# Patient Record
Sex: Male | Born: 1975
Health system: Southern US, Community
[De-identification: ages and names within clinical notes are randomized; demographics above are authoritative.]

## PROBLEM LIST (undated history)

## (undated) DIAGNOSIS — E785 Hyperlipidemia, unspecified: Secondary | ICD-10-CM

## (undated) DIAGNOSIS — G4730 Sleep apnea, unspecified: Secondary | ICD-10-CM

## (undated) DIAGNOSIS — I10 Essential (primary) hypertension: Secondary | ICD-10-CM

## (undated) DIAGNOSIS — Z992 Dependence on renal dialysis: Secondary | ICD-10-CM

## (undated) DIAGNOSIS — N186 End stage renal disease: Secondary | ICD-10-CM

## (undated) DIAGNOSIS — E119 Type 2 diabetes mellitus without complications: Secondary | ICD-10-CM

## (undated) DIAGNOSIS — N189 Chronic kidney disease, unspecified: Secondary | ICD-10-CM

## (undated) HISTORY — DX: Hyperlipidemia, unspecified: E78.5

## (undated) HISTORY — DX: Essential (primary) hypertension: I10

## (undated) HISTORY — DX: Chronic kidney disease, unspecified: N18.9

## (undated) HISTORY — DX: Type 2 diabetes mellitus without complications: E11.9

---

## 2006-08-01 ENCOUNTER — Emergency Department (HOSPITAL_COMMUNITY): Admission: EM | Admit: 2006-08-01 | Discharge: 2006-08-01 | Payer: Self-pay | Admitting: Emergency Medicine

## 2011-07-16 ENCOUNTER — Emergency Department (HOSPITAL_COMMUNITY)
Admission: EM | Admit: 2011-07-16 | Discharge: 2011-07-16 | Disposition: A | Discharge status: Home / Self Care | Payer: Self-pay | Source: Home / Self Care

## 2011-07-16 DIAGNOSIS — K529 Noninfective gastroenteritis and colitis, unspecified: Secondary | ICD-10-CM

## 2011-07-16 DIAGNOSIS — K5289 Other specified noninfective gastroenteritis and colitis: Secondary | ICD-10-CM

## 2011-07-16 MED ORDER — ONDANSETRON 4 MG PO TBDP
8.0000 mg | ORAL_TABLET | Freq: Once | ORAL | Status: AC
  Administered 2011-07-16: 8 mg via ORAL

## 2011-07-16 MED ORDER — ONDANSETRON 4 MG PO TBDP
ORAL_TABLET | ORAL | Status: AC
  Filled 2011-07-16: qty 2

## 2011-07-16 MED ORDER — ONDANSETRON HCL 4 MG PO TABS: 4.0000 mg | ORAL_TABLET | Freq: Three times a day (TID) | ORAL | Status: AC | PRN

## 2011-07-16 MED ORDER — LOPERAMIDE HCL 2 MG PO CAPS: ORAL_CAPSULE | ORAL | Status: DC

## 2011-07-16 NOTE — ED Provider Notes (Signed)
History     CSN: [id number] Arrival date & time: 07/16/2011  5:27 PM   None     Chief Complaint  Patient presents with  . Emesis    Pt has vomiting and diarrhea since Sat am  . Diarrhea    (Consider location/radiation/quality/duration/timing/severity/associated sxs/prior treatment) HPI Comments: Pt states he began with N/V/D yesterday after eating at a relatives home the day before. He knows one other family member reported not feeling well but uncertain if has the same symptoms. Diarrhea is watery and occurs every hour. Even awakens him from sleep. Stomach cramping but no abd pain. Nausea and vomiting. No fever. Denies recent travel or antibiotics.   Patient is a 35 y.o. male presenting with diarrhea. The history is provided by the patient.  Diarrhea The primary symptoms include nausea, vomiting and diarrhea. Primary symptoms do not include fever, abdominal pain, dysuria or myalgias. The illness began yesterday. The onset was sudden. The problem has not changed since onset. Nausea began yesterday. The nausea is exacerbated by food.  The vomiting began yesterday. Vomiting occurs 2 to 5 times per day. The emesis contains stomach contents. Risk factors for illness leading to emesis include suspect food intake.  The diarrhea began yesterday. The diarrhea is watery. The diarrhea occurs more than 10 times per day. Risk factors for illness producing diarrhea include suspect food intake.  The illness does not include chills or constipation.    History reviewed. No pertinent past medical history.  History reviewed. No pertinent past surgical history.  History reviewed. No pertinent family history.  History  Substance Use Topics  . Smoking status: Never Smoker   . Smokeless tobacco: Not on file  . Alcohol Use: No      Review of Systems  Constitutional: Negative for fever and chills.  Respiratory: Negative for shortness of breath.   Cardiovascular: Negative for chest pain.    Gastrointestinal: Positive for nausea, vomiting and diarrhea. Negative for abdominal pain, constipation and abdominal distention.  Genitourinary: Negative for dysuria, frequency and decreased urine volume.  Musculoskeletal: Negative for myalgias.  Neurological: Negative for dizziness and headaches.    Allergies  Review of patient's allergies indicates no known allergies.  Home Medications   Current Outpatient Rx  Name Route Sig Dispense Refill  . LOPERAMIDE HCL 2 MG PO CAPS  Initial dose 4 mg, then 2 mg after each loose stool. Maximum of 8 caps per 24 hrs. 24 capsule 0  . ONDANSETRON HCL 4 MG PO TABS Oral Take 1 tablet (4 mg total) by mouth every 8 (eight) hours as needed for nausea. 6 tablet 0    BP 148/102  Pulse 98  Temp(Src) 98.7 F (37.1 C) (Oral)  Resp 18  SpO2 97%  Physical Exam  Nursing note and vitals reviewed. Constitutional: He appears well-developed and well-nourished. No distress.  HENT:  Head: Normocephalic and atraumatic.  Right Ear: Tympanic membrane, external ear and ear canal normal.  Left Ear: Tympanic membrane, external ear and ear canal normal.  Nose: Nose normal.  Mouth/Throat: Oropharynx is clear and moist and mucous membranes are normal. No oropharyngeal exudate.  Neck: Neck supple.  Cardiovascular: Normal rate, regular rhythm and normal heart sounds.   Pulmonary/Chest: Effort normal and breath sounds normal. No respiratory distress.  Abdominal: Soft. Bowel sounds are normal. He exhibits no distension and no mass. There is tenderness (mild, diffuse). There is no rebound and no guarding.  Lymphadenopathy:    He has no cervical adenopathy.  Neurological: He is  alert.  Skin: Skin is warm and dry.  Psychiatric: He has a normal mood and affect.    ED Course  Procedures (including critical care time)  Labs Reviewed - No data to display No results found.   1. Gastroenteritis       MDM  BP rechecked        Carmel [place], PA 07/16/11  [place], [place] 07/16/11 [phone]

## 2011-07-17 NOTE — ED Provider Notes (Signed)
Medical screening examination/treatment/procedure(s) were performed by non-physician practitioner and as supervising physician I was immediately available for consultation/collaboration.   Pauline Good MD.    Pauline Good, MD 07/17/11 [phone]

## 2017-11-07 DIAGNOSIS — E1159 Type 2 diabetes mellitus with other circulatory complications: Secondary | ICD-10-CM | POA: Insufficient documentation

## 2017-11-07 DIAGNOSIS — I10 Essential (primary) hypertension: Secondary | ICD-10-CM | POA: Insufficient documentation

## 2017-11-07 DIAGNOSIS — E66811 Obesity, class 1: Secondary | ICD-10-CM | POA: Insufficient documentation

## 2017-11-07 DIAGNOSIS — M2141 Flat foot [pes planus] (acquired), right foot: Secondary | ICD-10-CM | POA: Insufficient documentation

## 2018-02-11 ENCOUNTER — Ambulatory Visit: Payer: BLUE CROSS/BLUE SHIELD | Admitting: Podiatry

## 2018-02-11 ENCOUNTER — Encounter: Payer: Self-pay | Admitting: Podiatry

## 2018-02-11 ENCOUNTER — Ambulatory Visit (INDEPENDENT_AMBULATORY_CARE_PROVIDER_SITE_OTHER): Payer: BLUE CROSS/BLUE SHIELD

## 2018-02-11 DIAGNOSIS — M722 Plantar fascial fibromatosis: Secondary | ICD-10-CM

## 2018-02-11 DIAGNOSIS — M779 Enthesopathy, unspecified: Secondary | ICD-10-CM

## 2018-02-11 MED ORDER — MELOXICAM 15 MG PO TABS: 15.0000 mg | ORAL_TABLET | Freq: Every day | ORAL | 0 refills | Status: DC

## 2018-02-11 NOTE — Patient Instructions (Signed)
Plantar Fasciitis (Heel Spur Syndrome) with Rehab The plantar fascia is a fibrous, ligament-like, soft-tissue structure that spans the bottom of the foot. Plantar fasciitis is a condition that causes pain in the foot due to inflammation of the tissue. SYMPTOMS   Pain and tenderness on the underneath side of the foot.  Pain that worsens with standing or walking. CAUSES  Plantar fasciitis is caused by irritation and injury to the plantar fascia on the underneath side of the foot. Common mechanisms of injury include:  Direct trauma to bottom of the foot.  Damage to a small nerve that runs under the foot where the main fascia attaches to the heel bone.  Stress placed on the plantar fascia due to bone spurs. RISK INCREASES WITH:   Activities that place stress on the plantar fascia (running, jumping, pivoting, or cutting).  Poor strength and flexibility.  Improperly fitted shoes.  Tight calf muscles.  Flat feet.  Failure to warm-up properly before activity.  Obesity. PREVENTION  Warm up and stretch properly before activity.  Allow for adequate recovery between workouts.  Maintain physical fitness:  Strength, flexibility, and endurance.  Cardiovascular fitness.  Maintain a health body weight.  Avoid stress on the plantar fascia.  Wear properly fitted shoes, including arch supports for individuals who have flat feet.  PROGNOSIS  If treated properly, then the symptoms of plantar fasciitis usually resolve without surgery. However, occasionally surgery is necessary.  RELATED COMPLICATIONS   Recurrent symptoms that may result in a chronic condition.  Problems of the lower back that are caused by compensating for the injury, such as limping.  Pain or weakness of the foot during push-off following surgery.  Chronic inflammation, scarring, and partial or complete fascia tear, occurring more often from repeated injections.  TREATMENT  Treatment initially involves the  use of ice and medication to help reduce pain and inflammation. The use of strengthening and stretching exercises may help reduce pain with activity, especially stretches of the Achilles tendon. These exercises may be performed at home or with a therapist. Your caregiver may recommend that you use heel cups of arch supports to help reduce stress on the plantar fascia. Occasionally, corticosteroid injections are given to reduce inflammation. If symptoms persist for greater than 6 months despite non-surgical (conservative), then surgery may be recommended.   MEDICATION   If pain medication is necessary, then nonsteroidal anti-inflammatory medications, such as aspirin and ibuprofen, or other minor pain relievers, such as acetaminophen, are often recommended.  Do not take pain medication within 7 days before surgery.  Prescription pain relievers may be given if deemed necessary by your caregiver. Use only as directed and only as much as you need.  Corticosteroid injections may be given by your caregiver. These injections should be reserved for the most serious cases, because they may only be given a certain number of times.  HEAT AND COLD  Cold treatment (icing) relieves pain and reduces inflammation. Cold treatment should be applied for 10 to 15 minutes every 2 to 3 hours for inflammation and pain and immediately after any activity that aggravates your symptoms. Use ice packs or massage the area with a piece of ice (ice massage).  Heat treatment may be used prior to performing the stretching and strengthening activities prescribed by your caregiver, physical therapist, or athletic trainer. Use a heat pack or soak the injury in warm water.  SEEK IMMEDIATE MEDICAL CARE IF:  Treatment seems to offer no benefit, or the condition worsens.  Any medications   produce adverse side effects.  EXERCISES- RANGE OF MOTION (ROM) AND STRETCHING EXERCISES - Plantar Fasciitis (Heel Spur Syndrome) These exercises  may help you when beginning to rehabilitate your injury. Your symptoms may resolve with or without further involvement from your physician, physical therapist or athletic trainer. While completing these exercises, remember:   Restoring tissue flexibility helps normal motion to return to the joints. This allows healthier, less painful movement and activity.  An effective stretch should be held for at least 30 seconds.  A stretch should never be painful. You should only feel a gentle lengthening or release in the stretched tissue.  RANGE OF MOTION - Toe Extension, Flexion  Sit with your right / left leg crossed over your opposite knee.  Grasp your toes and gently pull them back toward the top of your foot. You should feel a stretch on the bottom of your toes and/or foot.  Hold this stretch for 10 seconds.  Now, gently pull your toes toward the bottom of your foot. You should feel a stretch on the top of your toes and or foot.  Hold this stretch for 10 seconds. Repeat  times. Complete this stretch 3 times per day.   RANGE OF MOTION - Ankle Dorsiflexion, Active Assisted  Remove shoes and sit on a chair that is preferably not on a carpeted surface.  Place right / left foot under knee. Extend your opposite leg for support.  Keeping your heel down, slide your right / left foot back toward the chair until you feel a stretch at your ankle or calf. If you do not feel a stretch, slide your bottom forward to the edge of the chair, while still keeping your heel down.  Hold this stretch for 10 seconds. Repeat 3 times. Complete this stretch 2 times per day.   STRETCH  Gastroc, Standing  Place hands on wall.  Extend right / left leg, keeping the front knee somewhat bent.  Slightly point your toes inward on your back foot.  Keeping your right / left heel on the floor and your knee straight, shift your weight toward the wall, not allowing your back to arch.  You should feel a gentle stretch  in the right / left calf. Hold this position for 10 seconds. Repeat 3 times. Complete this stretch 2 times per day.  STRETCH  Soleus, Standing  Place hands on wall.  Extend right / left leg, keeping the other knee somewhat bent.  Slightly point your toes inward on your back foot.  Keep your right / left heel on the floor, bend your back knee, and slightly shift your weight over the back leg so that you feel a gentle stretch deep in your back calf.  Hold this position for 10 seconds. Repeat 3 times. Complete this stretch 2 times per day.  STRETCH  Gastrocsoleus, Standing  Note: This exercise can place a lot of stress on your foot and ankle. Please complete this exercise only if specifically instructed by your caregiver.   Place the ball of your right / left foot on a step, keeping your other foot firmly on the same step.  Hold on to the wall or a rail for balance.  Slowly lift your other foot, allowing your body weight to press your heel down over the edge of the step.  You should feel a stretch in your right / left calf.  Hold this position for 10 seconds.  Repeat this exercise with a slight bend in your right /   left knee. Repeat 3 times. Complete this stretch 2 times per day.   STRENGTHENING EXERCISES - Plantar Fasciitis (Heel Spur Syndrome)  These exercises may help you when beginning to rehabilitate your injury. They may resolve your symptoms with or without further involvement from your physician, physical therapist or athletic trainer. While completing these exercises, remember:   Muscles can gain both the endurance and the strength needed for everyday activities through controlled exercises.  Complete these exercises as instructed by your physician, physical therapist or athletic trainer. Progress the resistance and repetitions only as guided.  STRENGTH - Towel Curls  Sit in a chair positioned on a non-carpeted surface.  Place your foot on a towel, keeping your heel  on the floor.  Pull the towel toward your heel by only curling your toes. Keep your heel on the floor. Repeat 3 times. Complete this exercise 2 times per day.  STRENGTH - Ankle Inversion  Secure one end of a rubber exercise band/tubing to a fixed object (table, pole). Loop the other end around your foot just before your toes.  Place your fists between your knees. This will focus your strengthening at your ankle.  Slowly, pull your big toe up and in, making sure the band/tubing is positioned to resist the entire motion.  Hold this position for 10 seconds.  Have your muscles resist the band/tubing as it slowly pulls your foot back to the starting position. Repeat 3 times. Complete this exercises 2 times per day.  Document Released: 08/07/2005 Document Revised: 10/30/2011 Document Reviewed: 11/19/2008 [facility] Patient Information 2014 [place], [place].   Peroneal Tendinopathy Rehab Ask your health care provider which exercises are safe for you. Do exercises exactly as told by your health care provider and adjust them as directed. It is normal to feel mild stretching, pulling, tightness, or discomfort as you do these exercises, but you should stop right away if you feel sudden pain or your pain gets worse.Do not begin these exercises until told by your health care provider. Stretching and range of motion exercises These exercises warm up your muscles and joints and improve the movement and flexibility of your ankle. These exercises also help to relieve pain and stiffness. Exercise A: Gastroc and soleus, standing 1. Stand on the edge of a step on the balls of your feet. The ball of your foot is on the walking surface, right under your toes. 2. Hold onto the railing for balance. 3. Slowly lift your left / right foot, allowing your body weight to press your left / right heel down over the edge of the step. You should feel a stretch in your left / right calf. 4. Hold this position for __________  seconds. Repeat __________ times with your left / right knee straight and __________ times with your left / right knee bent. Complete this stretch __________ times per day. Strengthening exercises These exercises improve the strength and endurance of your foot and ankle. Endurance is the ability to use your muscles for a long time, even after they get tired. Exercise B: Dorsiflexors  1. Secure a rubber exercise band or tube to an object, like a table leg, that will not move if it is pulled on. 2. Secure the other end of the band around your left / right foot. 3. Sit on the floor, facing the object with your left / right foot extended. The band or tube should be slightly tense when your foot is relaxed. 4. Slowly flex your left / right ankle and  toes to bring your foot toward you. 5. Hold this position for __________ seconds. 6. Slowly return your foot to the starting position. Repeat __________ times. Complete this exercise __________ times per day. Exercise C: Evertors 1. Sit on the floor with your legs straight out in front of you. 2. Loop a rubber exercise or band or tube around the ball of your left / right foot. The ball of your foot is on the walking surface, right under your toes. 3. Hold the ends of the band in your hands, or secure the band to a stable object. 4. Slowly push your foot outward, away from your other leg. 5. Hold this position for __________ seconds. 6. Slowly return your foot to the starting position. Repeat __________ times. Complete this exercise __________ times per day. Exercise D: Standing heel raise ( plantar flexion) 1. Stand with your feet shoulder-width apart with the balls of your feet on a step. The ball of your foot is on the walking surface, right under your toes. 2. Keep your weight spread evenly over the width of your feet while you rise up on your toes. Use a wall or railing to steady yourself, but try not to use it for support. 3. If this exercise is  too easy, try these options: ? Shift your weight toward your left / right leg until you feel challenged. ? If told by your health care provider, stand on your left / right leg only. 4. Hold this position for __________ seconds. Repeat __________ times. Complete this exercise __________ times per day. Exercise E: Single leg stand 1. Without shoes, stand near a railing or in a doorway. You may hold onto the railing or door frame as needed. 2. Stand on your left / right foot. Keep your big toe down on the floor and try to keep your arch lifted. ? Do not roll to the outside of your foot. ? If this exercise is too easy, you can try it with your eyes closed or while standing on a pillow. 3. Hold this position for __________ seconds. Repeat __________ times. Complete this exercise __________ times per day. This information is not intended to replace advice given to you by your health care provider. Make sure you discuss any questions you have with your health care provider. Document Released: 08/07/2005 Document Revised: 04/13/2016 Document Reviewed: 06/26/2015 [employer] Interactive Patient Education  [employer].

## 2018-02-11 NOTE — Progress Notes (Signed)
   Subjective:    Patient ID: Joshua Mullins, male    DOB: 11/06/1975, 42 y.o.   MRN: [id number]  HPI 42 year old male presents the office today for concerns of pain to both of his feet with the right side worse than left.  He states this sore on the bottom on Saturday started there is some pain with side of his foot.  He did note some mild swelling but denies any recent injury or trauma.  Still with pressure after prolonged activity.  Has been trying ice and ibuprofen which does help.  He states that after rest he notices some stiffness and it hurts to lay on.  Denies any numbness or tingling.  No other concerns.   Review of Systems  All other systems reviewed and are negative.  History reviewed. No pertinent past medical history.  History reviewed. No pertinent surgical history.   Current Outpatient Medications:  .  loperamide (IMODIUM) 2 MG capsule, Initial dose 4 mg, then 2 mg after each loose stool. Maximum of 8 caps per 24 hrs., Disp: 24 capsule, Rfl: 0 .  meloxicam (MOBIC) 15 MG tablet, Take 1 tablet (15 mg total) by mouth daily., Disp: 30 tablet, Rfl: 0  No Known Allergies       Objective:   Physical Exam  General: AAO x3, NAD  Dermatological: Skin is warm, dry and supple bilateral. Nails x 10 are well manicured; remaining integument appears unremarkable at this time. There are no open sores, no preulcerative lesions, no rash or signs of infection present.  Vascular: Dorsalis Pedis artery and Posterior Tibial artery pedal pulses are 2/4 bilateral with immedate capillary fill time. Pedal hair growth present. No varicosities and no lower extremity edema present bilateral. There is no pain with calf compression, swelling, warmth, erythema.   Neruologic: Grossly intact via light touch bilateral. Vibratory intact via tuning fork bilateral. Protective threshold with Semmes Wienstein monofilament intact to all pedal sites bilateral.  Tinel sign negative.  Musculoskeletal: There  is minimal discomfort on palpation of the plantar medial tubercle of the calcaneus at the insertion of plantar fascia bilaterally.  There is also some mild discomfort on the course of the peroneal tendon more in the left side and is just posterior and inferior to the lateral malleolus on the insertion of the fifth metatarsal base.  Overall the peroneal tendon appears to be intact.  There is no pain with lateral compression of calcaneus.  Achilles tendon appears to be intact.  There is no overlying edema, erythema, increase in warmth.  Muscular strength 5/5 in all groups tested bilateral.  Mild midfoot dorsal exostosis palpable on the left midfoot.  Gait: Unassisted, Nonantalgic.      Assessment & Plan:  42 year old male with bilateral foot pain likely plantar fasciitis with peroneal tendinitis -Treatment options discussed including all alternatives, risks, and complications -Etiology of symptoms were discussed -X-rays were obtained and reviewed with the patient.  There is no evidence of acute fracture or stress fracture identified today.  Heel spurs are present. -Prescribed mobic. Discussed side effects of the medication and directed to stop if any are to occur and call the office.  -Stretching and icing daily -Plantar fascial braces -Discussed shoe modifications and orthotics.  -Discussed steroid injection  Return in about 3 weeks (around 03/04/2018).  Trula Slade DPM

## 2018-03-05 ENCOUNTER — Ambulatory Visit: Payer: BLUE CROSS/BLUE SHIELD | Admitting: Podiatry

## 2018-03-10 ENCOUNTER — Other Ambulatory Visit: Payer: Self-pay | Admitting: Podiatry

## 2018-03-20 ENCOUNTER — Other Ambulatory Visit: Payer: Self-pay | Admitting: Nephrology

## 2018-03-20 DIAGNOSIS — N184 Chronic kidney disease, stage 4 (severe): Secondary | ICD-10-CM

## 2018-03-20 DIAGNOSIS — R809 Proteinuria, unspecified: Secondary | ICD-10-CM

## 2018-03-20 DIAGNOSIS — I129 Hypertensive chronic kidney disease with stage 1 through stage 4 chronic kidney disease, or unspecified chronic kidney disease: Secondary | ICD-10-CM

## 2018-03-20 DIAGNOSIS — E669 Obesity, unspecified: Secondary | ICD-10-CM

## 2018-03-27 ENCOUNTER — Ambulatory Visit
Admission: RE | Admit: 2018-03-27 | Discharge: 2018-03-27 | Disposition: A | Discharge status: Home / Self Care | Payer: Self-pay | Source: Ambulatory Visit | Attending: Nephrology | Admitting: Nephrology

## 2018-03-27 DIAGNOSIS — E669 Obesity, unspecified: Secondary | ICD-10-CM

## 2018-03-27 DIAGNOSIS — I129 Hypertensive chronic kidney disease with stage 1 through stage 4 chronic kidney disease, or unspecified chronic kidney disease: Secondary | ICD-10-CM

## 2018-03-27 DIAGNOSIS — R809 Proteinuria, unspecified: Secondary | ICD-10-CM

## 2018-03-27 DIAGNOSIS — N184 Chronic kidney disease, stage 4 (severe): Secondary | ICD-10-CM

## 2018-04-18 ENCOUNTER — Other Ambulatory Visit: Payer: Self-pay | Admitting: Nephrology

## 2018-04-18 DIAGNOSIS — N281 Cyst of kidney, acquired: Secondary | ICD-10-CM

## 2018-04-30 ENCOUNTER — Ambulatory Visit
Admission: RE | Admit: 2018-04-30 | Discharge: 2018-04-30 | Disposition: A | Discharge status: Home / Self Care | Payer: BLUE CROSS/BLUE SHIELD | Source: Ambulatory Visit | Attending: Nephrology | Admitting: Nephrology

## 2018-04-30 DIAGNOSIS — N281 Cyst of kidney, acquired: Secondary | ICD-10-CM

## 2019-03-25 DIAGNOSIS — E559 Vitamin D deficiency, unspecified: Secondary | ICD-10-CM | POA: Insufficient documentation

## 2019-07-04 DIAGNOSIS — E1169 Type 2 diabetes mellitus with other specified complication: Secondary | ICD-10-CM | POA: Insufficient documentation

## 2019-07-08 DIAGNOSIS — Z9989 Dependence on other enabling machines and devices: Secondary | ICD-10-CM | POA: Insufficient documentation

## 2019-07-08 DIAGNOSIS — G4733 Obstructive sleep apnea (adult) (pediatric): Secondary | ICD-10-CM | POA: Insufficient documentation

## 2019-10-08 DIAGNOSIS — M109 Gout, unspecified: Secondary | ICD-10-CM | POA: Insufficient documentation

## 2019-10-08 DIAGNOSIS — E1140 Type 2 diabetes mellitus with diabetic neuropathy, unspecified: Secondary | ICD-10-CM | POA: Insufficient documentation

## 2020-01-20 IMAGING — US US RENAL
1 series · 14 of 25 positions shown · non-contrast
Comparison: None.

CLINICAL DATA: 42-year-old male with chronic kidney disease stage
4.

EXAM:
RENAL / URINARY TRACT ULTRASOUND COMPLETE

[Series 1: us renal · 0.25mm/px · 14 of 44 slices shown]
[im 1/44]
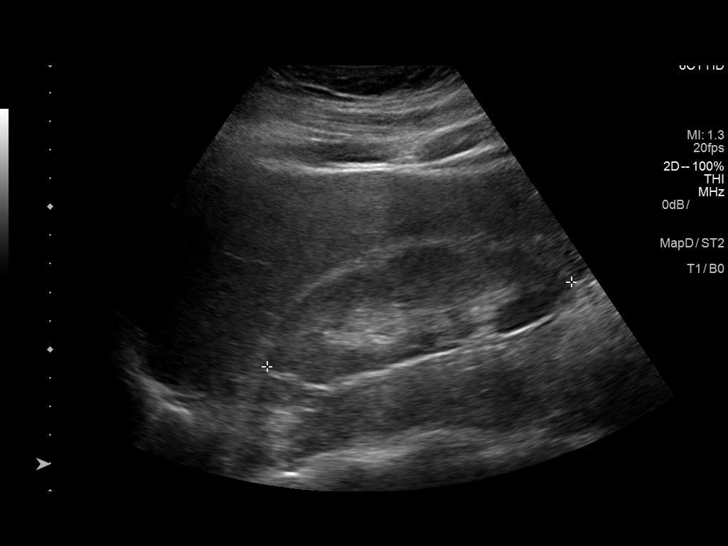
[im 4/44]
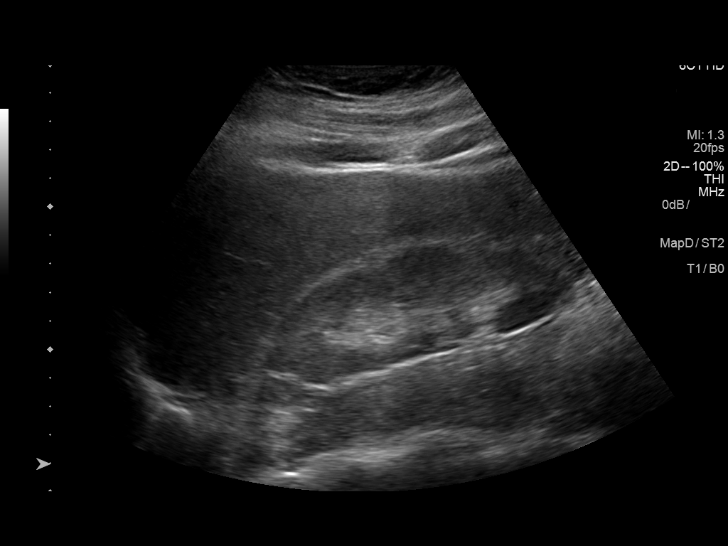
[im 8/44]
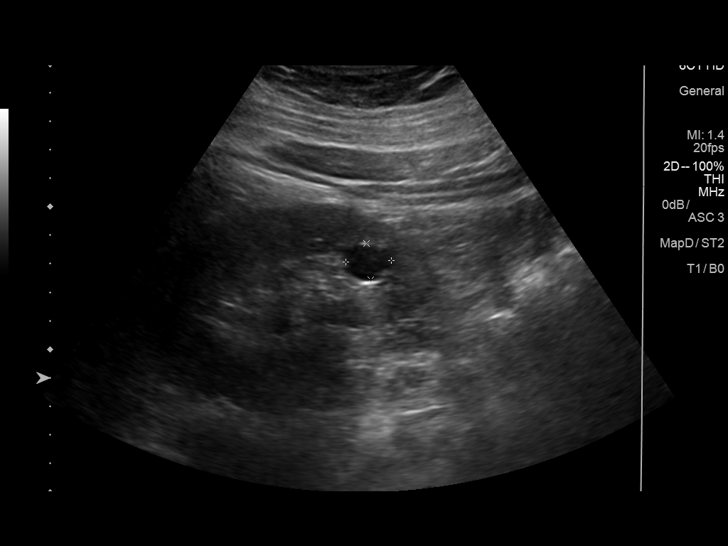
[im 11/44]
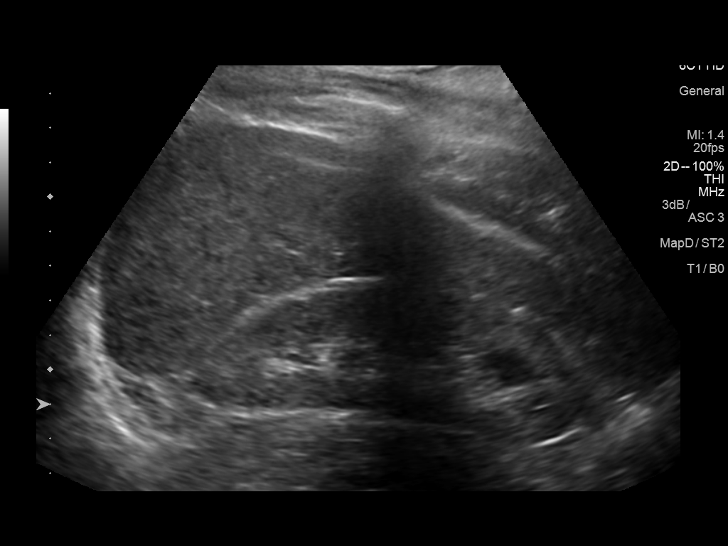
[im 15/44]
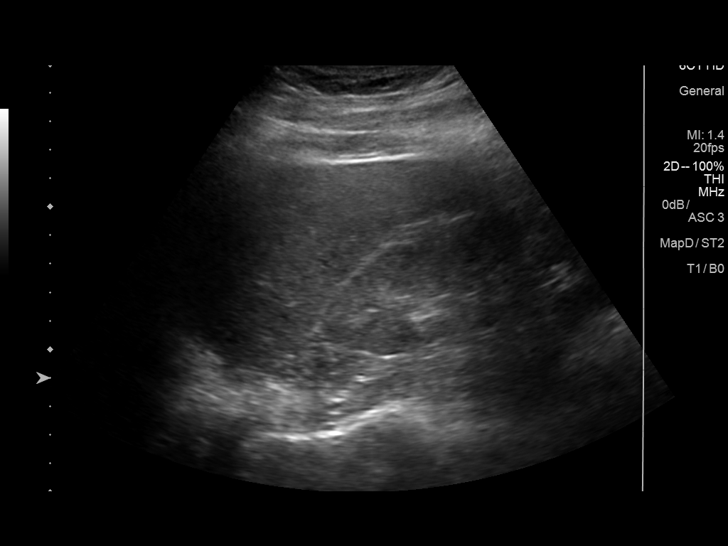
[im 17/44]
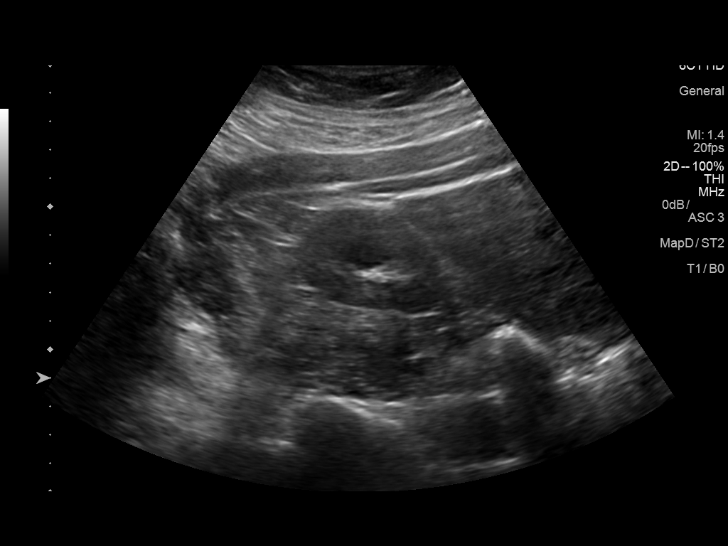
[im 20/44]
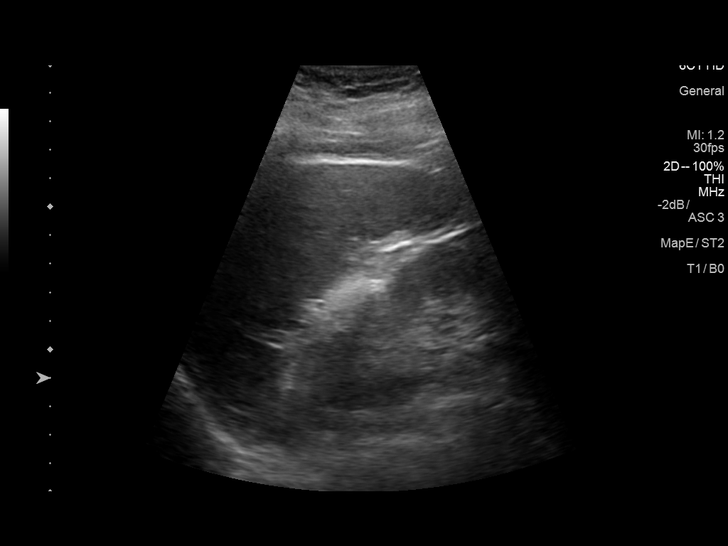
[im 24/44]
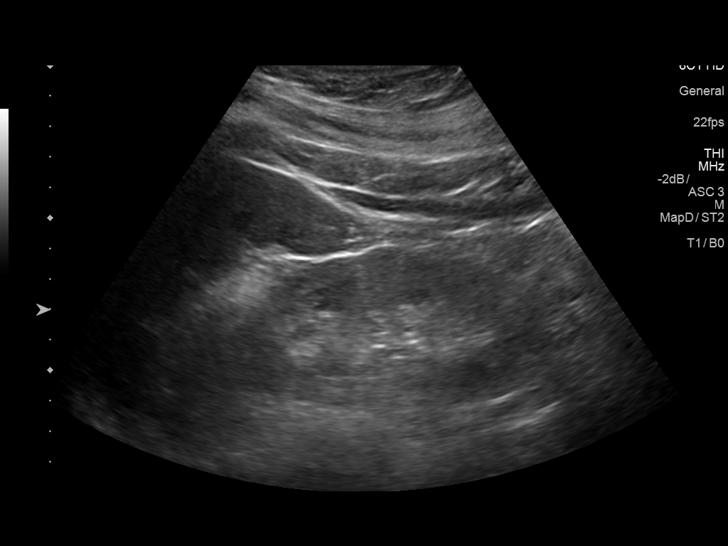
[im 27/44]
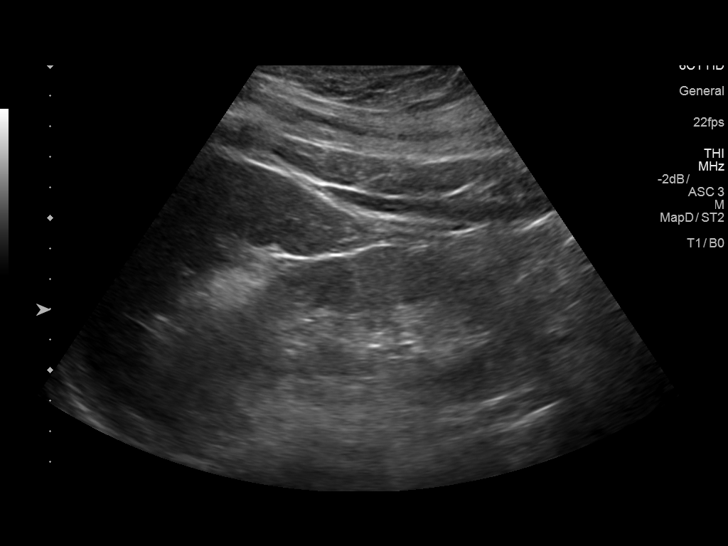
[im 29/44]
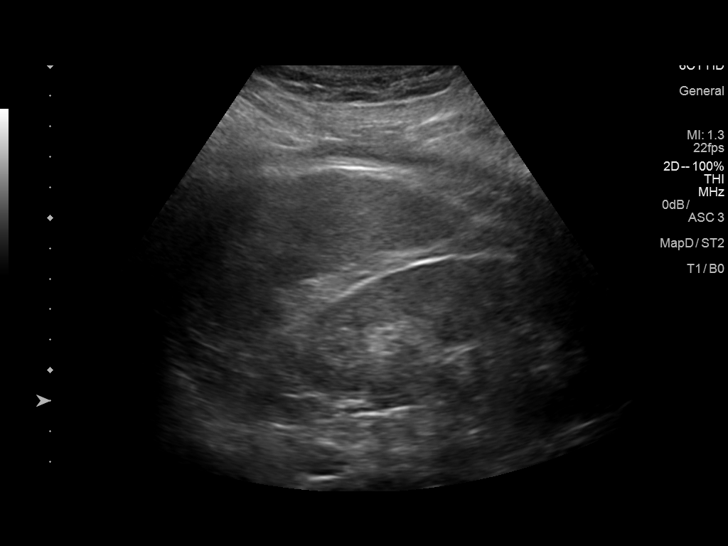
[im 33/44]
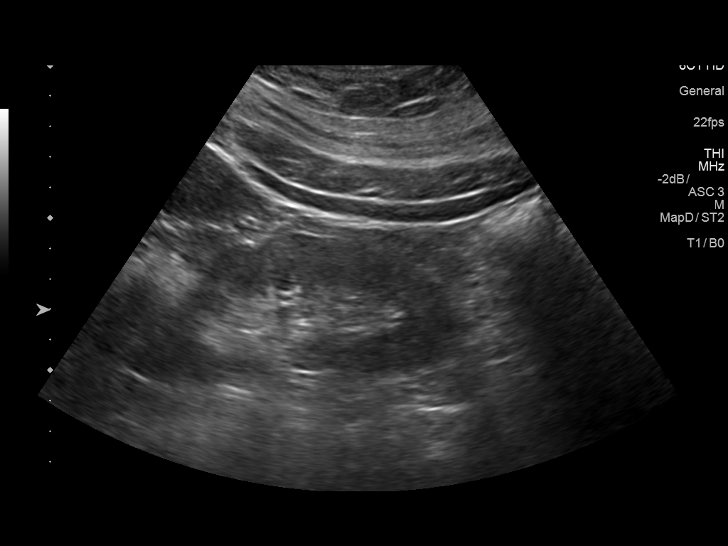
[im 36/44]
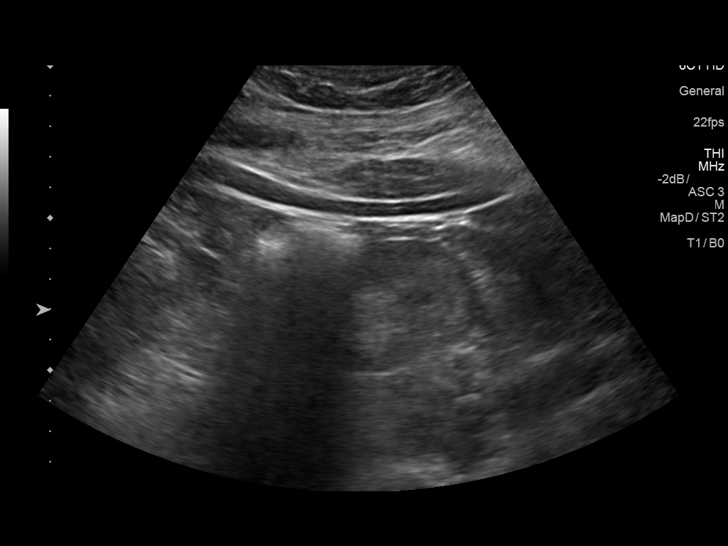
[im 40/44]
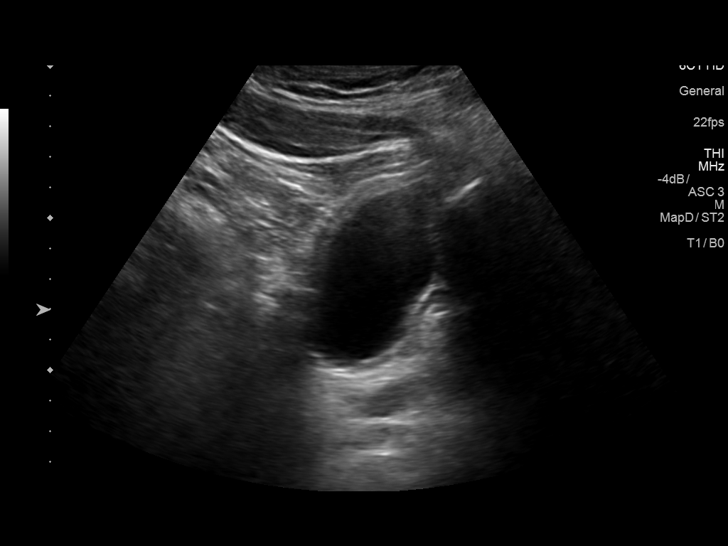
[im 44/44]
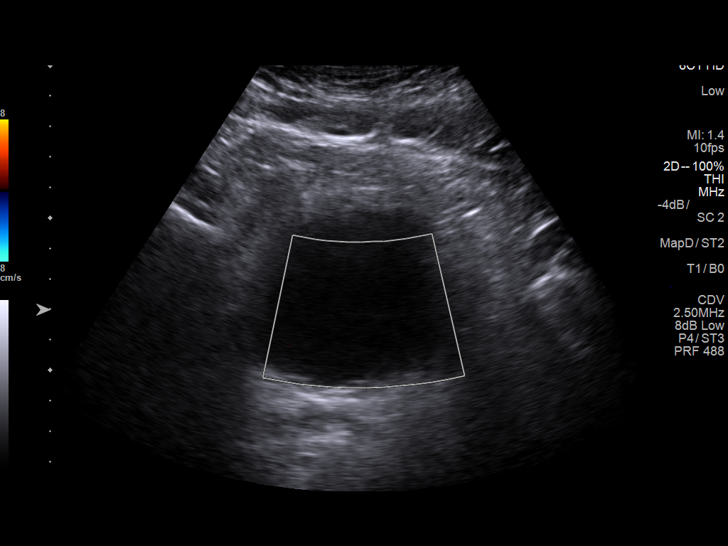

[14 of 25 positions shown; findings below may reference images not displayed]

FINDINGS: Right Kidney:

Length: 11.6 cm. Echogenicity normal to minimally increased. No
hydronephrosis. Lower pole 1.6 x 1.3 x 1.2 cm minimally complex cyst
(containing minimal internal echoes versus artifact).

Left Kidney:

Length: 10.2 cm. Echogenicity normal to minimally increased. No
hydronephrosis or mass.

Bladder:

Appears normal for degree of bladder distention.
IMPRESSION: 1. Renal parenchyma echogenicity normal to minimally increased. No
hydronephrosis. Right lower pole minimally complex 1.6 cm cyst.
2. Exam slightly limited by habitus.

## 2020-12-14 ENCOUNTER — Other Ambulatory Visit: Payer: Self-pay

## 2020-12-14 DIAGNOSIS — N184 Chronic kidney disease, stage 4 (severe): Secondary | ICD-10-CM

## 2020-12-27 ENCOUNTER — Other Ambulatory Visit: Payer: Self-pay

## 2020-12-27 ENCOUNTER — Ambulatory Visit (INDEPENDENT_AMBULATORY_CARE_PROVIDER_SITE_OTHER)
Admission: RE | Admit: 2020-12-27 | Discharge: 2020-12-27 | Disposition: A | Discharge status: Home / Self Care | Payer: BC Managed Care – PPO | Source: Ambulatory Visit | Attending: Surgery | Admitting: Surgery

## 2020-12-27 ENCOUNTER — Ambulatory Visit (HOSPITAL_COMMUNITY)
Admission: RE | Admit: 2020-12-27 | Discharge: 2020-12-27 | Disposition: A | Discharge status: Home / Self Care | Payer: BC Managed Care – PPO | Source: Ambulatory Visit | Attending: Surgery | Admitting: Surgery

## 2020-12-27 ENCOUNTER — Ambulatory Visit: Payer: BC Managed Care – PPO | Admitting: Surgery

## 2020-12-27 ENCOUNTER — Encounter: Payer: Self-pay | Admitting: Surgery

## 2020-12-27 VITALS — BP 114/84 | HR 89 | Temp 98.4°F | Resp 20 | Ht 70.0 in | Wt 243.0 lb

## 2020-12-27 DIAGNOSIS — N184 Chronic kidney disease, stage 4 (severe): Secondary | ICD-10-CM

## 2020-12-27 NOTE — Progress Notes (Signed)
[facility]ist of [place]  Patient name: Joshua Mullins MRN: [id number] DOB: 04/27/1976 Sex: male   REQUESTING PROVIDER:    Dr. Carolin Sicks   REASON FOR CONSULT:    Dialysis access  HISTORY OF PRESENT ILLNESS:   Joshua Mullins is a 45 y.o. male, who is for evaluation of dialysis access.  The patient is right-handed.  He has not had a pacemaker or defibrillator placed.  His renal failure is secondary to diabetes and hypertension.  Historically, the patient's diabetes has not been well controlled as his hemoglobin A1c has been greater than 12 in the past.  There is renal failure has been complicated by secondary hyperparathyroidism, hypocalcemia, and proteinuria.  The patient is a non-smoker.  PAST MEDICAL HISTORY    Past Medical History:  Diagnosis Date  . Chronic kidney disease   . Diabetes mellitus without complication ([place])   . Hyperlipidemia   . Hypertension      FAMILY HISTORY   History reviewed. No pertinent family history.  SOCIAL HISTORY:   Social History   Socioeconomic History  . Marital status: Married    Spouse name: Not on file  . Number of children: Not on file  . Years of education: Not on file  . Highest education level: Not on file  Occupational History  . Not on file  Tobacco Use  . Smoking status: Never Smoker  . Smokeless tobacco: Never Used  Vaping Use  . Vaping Use: Never used  Substance and Sexual Activity  . Alcohol use: No  . Drug use: No  . Sexual activity: Not on file  Other Topics Concern  . Not on file  Social History Narrative  . Not on file   Social Determinants of Health   Financial Resource Strain: Not on file  Food Insecurity: Not on file  Transportation Needs: Not on file  Physical Activity: Not on file  Stress: Not on file  Social Connections: Not on file  Intimate Partner Violence: Not on file    ALLERGIES:    Allergies  Allergen Reactions  .  Shellfish Allergy Swelling    CURRENT MEDICATIONS:    Current Outpatient Medications  Medication Sig Dispense Refill  . allopurinol (ZYLOPRIM) 300 MG tablet Take 300 mg by mouth daily.    Marland Kitchen atorvastatin (LIPITOR) 10 MG tablet Take 1 tablet by mouth daily.    . calcium acetate (PHOSLO) 667 MG capsule Take by mouth.    Marland Kitchen glipiZIDE (GLUCOTROL XL) 10 MG 24 hr tablet Take 10 mg by mouth daily.    Marland Kitchen latanoprost (XALATAN) 0.005 % ophthalmic solution 1 drop at bedtime.    Marland Kitchen loperamide (IMODIUM) 2 MG capsule Initial dose 4 mg, then 2 mg after each loose stool. Maximum of 8 caps per 24 hrs. 24 capsule 0  . metoprolol succinate (TOPROL-XL) 50 MG 24 hr tablet Take 50 mg by mouth daily.    . TRULICITY 1.5 0000000 SOPN Inject 1.5 mg into the skin once a week.    . Vitamin D, Ergocalciferol, (DRISDOL) 1.25 MG (50000 UNIT) CAPS capsule Take by mouth.     No current facility-administered medications for this visit.    REVIEW OF SYSTEMS:   '[X]'$  denotes positive finding, '[ ]'$  denotes negative finding Cardiac  Comments:  Chest pain or chest pressure:    Shortness of breath upon exertion:    Short of breath when lying flat:    Irregular heart rhythm:        Vascular  Pain in calf, thigh, or hip brought on by ambulation:    Pain in feet at night that wakes you up from your sleep:     Blood clot in your veins:    Leg swelling:         Pulmonary    Oxygen at home:    Productive cough:     Wheezing:         Neurologic    Sudden weakness in arms or legs:     Sudden numbness in arms or legs:     Sudden onset of difficulty speaking or slurred speech:    Temporary loss of vision in one eye:     Problems with dizziness:         Gastrointestinal    Blood in stool:      Vomited blood:         Genitourinary    Burning when urinating:     Blood in urine:        Psychiatric    Major depression:         Hematologic    Bleeding problems:    Problems with blood clotting too easily:         Skin    Rashes or ulcers:        Constitutional    Fever or chills:     PHYSICAL EXAM:   Vitals:   12/27/20 1342  BP: 114/84  Pulse: 89  Resp: 20  Temp: 98.4 F (36.9 C)  SpO2: 95%  Weight: 243 lb (110.2 kg)  Height: '5\' 10"'$  (1.778 m)    GENERAL: The patient is a well-nourished male, in no acute distress. The vital signs are documented above. CARDIAC: There is a regular rate and rhythm.  VASCULAR: Palpable radial pulses PULMONARY: Nonlabored respirations MUSCULOSKELETAL: There are no major deformities or cyanosis. NEUROLOGIC: No focal weakness or paresthesias are detected. SKIN: There are no ulcers or rashes noted. PSYCHIATRIC: The patient has a normal affect.  STUDIES:   I have reviewed his vein mapping.  The report cannot be created for technical issues.  His left basilic vein measures between 0.74 and 0.8 cm.  The left cephalic vein measures [id number] at the antecubital crease.  It is small in caliber in the upper arm.  At the wrist it measures 0.3 to  ASSESSMENT and PLAN   CKD4: I discussed with the patient and his wife, that I feel his best option is a stage basilic vein fistula creation on the left.  We discussed the details of the operation.  We discussed the risk of steal syndrome.  All questions were answered.  They will contact me for scheduling a date for his operation.  In the operating room I will need to evaluate the cephalic vein at the wrist to see if he is a candidate for radiocephalic fistula, before proceeding with a basilic vein fistula.   Leia Alf, MD, FACS [facility] [phone] Pager [phone]

## 2021-01-04 DIAGNOSIS — N281 Cyst of kidney, acquired: Secondary | ICD-10-CM | POA: Insufficient documentation

## 2021-01-04 DIAGNOSIS — Z01818 Encounter for other preprocedural examination: Secondary | ICD-10-CM | POA: Insufficient documentation

## 2021-06-23 ENCOUNTER — Other Ambulatory Visit: Payer: Self-pay

## 2021-06-23 DIAGNOSIS — N184 Chronic kidney disease, stage 4 (severe): Secondary | ICD-10-CM

## 2021-07-04 ENCOUNTER — Encounter: Payer: Self-pay | Admitting: Surgery

## 2021-07-04 ENCOUNTER — Ambulatory Visit (INDEPENDENT_AMBULATORY_CARE_PROVIDER_SITE_OTHER)
Admission: RE | Admit: 2021-07-04 | Discharge: 2021-07-04 | Disposition: A | Discharge status: Home / Self Care | Payer: BC Managed Care – PPO | Source: Ambulatory Visit | Attending: Surgery | Admitting: Surgery

## 2021-07-04 ENCOUNTER — Other Ambulatory Visit: Payer: Self-pay

## 2021-07-04 ENCOUNTER — Ambulatory Visit: Payer: BC Managed Care – PPO | Admitting: Surgery

## 2021-07-04 ENCOUNTER — Ambulatory Visit (HOSPITAL_COMMUNITY)
Admission: RE | Admit: 2021-07-04 | Discharge: 2021-07-04 | Disposition: A | Discharge status: Home / Self Care | Payer: BC Managed Care – PPO | Source: Ambulatory Visit | Attending: Surgery | Admitting: Surgery

## 2021-07-04 VITALS — BP 170/109 | HR 77 | Temp 98.2°F | Resp 20 | Ht 70.0 in | Wt 236.6 lb

## 2021-07-04 DIAGNOSIS — N185 Chronic kidney disease, stage 5: Secondary | ICD-10-CM

## 2021-07-04 DIAGNOSIS — N184 Chronic kidney disease, stage 4 (severe): Secondary | ICD-10-CM | POA: Insufficient documentation

## 2021-07-04 NOTE — H&P (View-Only) (Signed)
Vascular and Vein Specialist of [place]  Patient name: Joshua Mullins MRN: [id number] DOB: Dec 19, 1975 Sex: male   REASON FOR VISIT:    Follow up  HISOTRY OF PRESENT ILLNESS:    Joshua Mullins is a 45 y.o. male, who is for evaluation of dialysis access.  The patient is right-handed.  He has not had a pacemaker or defibrillator placed.  His renal failure is secondary to diabetes and hypertension.  I last saw him in April 2022.  I had recommended a for stage left basilic vein fistula.  This never occurred.  He is back today to discuss dialysis options again.  His GFR is 8%   Historically, the patient's diabetes has not been well controlled as his hemoglobin A1c has been greater than 12 in the past.  There is renal failure has been complicated by secondary hyperparathyroidism, hypocalcemia, and proteinuria.  The patient is a non-smoker.   PAST MEDICAL HISTORY:   Past Medical History:  Diagnosis Date   Chronic kidney disease    Diabetes mellitus without complication ([place])    Hyperlipidemia    Hypertension      FAMILY HISTORY:   History reviewed. No pertinent family history.  SOCIAL HISTORY:   Social History   Tobacco Use   Smoking status: Never   Smokeless tobacco: Never  Substance Use Topics   Alcohol use: No     ALLERGIES:   Allergies  Allergen Reactions   Amlodipine Swelling   Lisinopril Swelling   Shellfish Allergy Swelling     CURRENT MEDICATIONS:   Current Outpatient Medications  Medication Sig Dispense Refill   allopurinol (ZYLOPRIM) 300 MG tablet Take 300 mg by mouth daily.     atorvastatin (LIPITOR) 10 MG tablet Take 1 tablet by mouth daily.     calcium acetate (PHOSLO) 667 MG capsule Take by mouth.     glipiZIDE (GLUCOTROL XL) 10 MG 24 hr tablet Take 10 mg by mouth daily.     latanoprost (XALATAN) 0.005 % ophthalmic solution 1 drop at bedtime.     loperamide (IMODIUM) 2 MG capsule Initial dose 4 mg,  then 2 mg after each loose stool. Maximum of 8 caps per 24 hrs. 24 capsule 0   metoprolol succinate (TOPROL-XL) 50 MG 24 hr tablet Take 50 mg by mouth daily.     TRULICITY 1.5 GN/0.0BB SOPN Inject 1.5 mg into the skin once a week.     Vitamin D, Ergocalciferol, (DRISDOL) 1.25 MG (50000 UNIT) CAPS capsule Take by mouth.     No current facility-administered medications for this visit.    REVIEW OF SYSTEMS:   [X]  denotes positive finding, [ ]  denotes negative finding Cardiac  Comments:  Chest pain or chest pressure:    Shortness of breath upon exertion:    Short of breath when lying flat:    Irregular heart rhythm:        Vascular    Pain in calf, thigh, or hip brought on by ambulation:    Pain in feet at night that wakes you up from your sleep:     Blood clot in your veins:    Leg swelling:         Pulmonary    Oxygen at home:    Productive cough:     Wheezing:         Neurologic    Sudden weakness in arms or legs:     Sudden numbness in arms or legs:     Sudden onset  of difficulty speaking or slurred speech:    Temporary loss of vision in one eye:     Problems with dizziness:         Gastrointestinal    Blood in stool:     Vomited blood:         Genitourinary    Burning when urinating:     Blood in urine:        Psychiatric    Major depression:         Hematologic    Bleeding problems:    Problems with blood clotting too easily:        Skin    Rashes or ulcers:        Constitutional    Fever or chills:      PHYSICAL EXAM:   Vitals:   07/04/21 1449  BP: (!) 170/109  Pulse: 77  Resp: 20  Temp: 98.2 F (36.8 C)  SpO2: 97%  Weight: 236 lb 9.6 oz (107.3 kg)  Height: 5\' 10"  (1.778 m)    GENERAL: The patient is a well-nourished male, in no acute distress. The vital signs are documented above. CARDIAC: There is a regular rate and rhythm.  VASCULAR: Palpable left radial pulse MUSCULOSKELETAL: There are no major deformities or cyanosis. NEUROLOGIC: No  focal weakness or paresthesias are detected. SKIN: There are no ulcers or rashes noted. PSYCHIATRIC: The patient has a normal affect.  STUDIES:   I have reviewed his vascular studies with the following findings: +-----------------+-------------+----------+-------------------------------  +  Left Cephalic    Diameter (cm)Depth (cm)           Findings                +-----------------+-------------+----------+-------------------------------  +  Shoulder             0.12                                                 +-----------------+-------------+----------+-------------------------------  +  Prox upper arm       0.12                                                 +-----------------+-------------+----------+-------------------------------  +  Mid upper arm        0.13                                                 +-----------------+-------------+----------+-------------------------------  +  Dist upper arm       0.16                                                 +-----------------+-------------+----------+-------------------------------  +  Antecubital fossa    0.12                                                 +-----------------+-------------+----------+-------------------------------  +  Prox forearm         0.32               Arises from median cubital  vein  +-----------------+-------------+----------+-------------------------------  +  Mid forearm          0.37                                                 +-----------------+-------------+----------+-------------------------------  +  Dist forearm         0.29                                                 +-----------------+-------------+----------+-------------------------------  +   +-----------------+-------------+----------+--------+  Left Basilic     Diameter (cm)Depth (cm)Findings  +-----------------+-------------+----------+--------+  Prox  upper arm       0.87                         +-----------------+-------------+----------+--------+  Mid upper arm        0.34                         +-----------------+-------------+----------+--------+  Dist upper arm       0.60                         +-----------------+-------------+----------+--------+  Antecubital fossa    0.42                         +-----------------+-------------+----------+--------+  Prox forearm         0.37                         +-----------------+-------------+----------+--------+  MEDICAL ISSUES:   CKD 5: We discussed again proceeding with a staged left basilic vein fistula.  I will evaluate his cephalic vein at the wrist as he has a cephalic vein with into the basilic system.  He potentially could have a radiocephalic fistula.  We discussed the details of the surgery including the risks and benefits, the risk of steal, and the risk of maturity.  He is getting get this done within the next week or 2.  His son is also being evaluated for living related transplant    Leia Alf, MD, FACS [facility] [phone] Pager [phone]

## 2021-07-04 NOTE — Progress Notes (Signed)
Vascular and Vein Specialist of [place]  Patient name: Joshua Mullins MRN: [id number] DOB: 1976/04/27 Sex: male   REASON FOR VISIT:    Follow up  HISOTRY OF PRESENT ILLNESS:    Joshua Mullins is a 45 y.o. male, who is for evaluation of dialysis access.  The patient is right-handed.  He has not had a pacemaker or defibrillator placed.  His renal failure is secondary to diabetes and hypertension.  I last saw him in April 2022.  I had recommended a for stage left basilic vein fistula.  This never occurred.  He is back today to discuss dialysis options again.  His GFR is 8%   Historically, the patient's diabetes has not been well controlled as his hemoglobin A1c has been greater than 12 in the past.  There is renal failure has been complicated by secondary hyperparathyroidism, hypocalcemia, and proteinuria.  The patient is a non-smoker.   PAST MEDICAL HISTORY:   Past Medical History:  Diagnosis Date   Chronic kidney disease    Diabetes mellitus without complication ([place])    Hyperlipidemia    Hypertension      FAMILY HISTORY:   History reviewed. No pertinent family history.  SOCIAL HISTORY:   Social History   Tobacco Use   Smoking status: Never   Smokeless tobacco: Never  Substance Use Topics   Alcohol use: No     ALLERGIES:   Allergies  Allergen Reactions   Amlodipine Swelling   Lisinopril Swelling   Shellfish Allergy Swelling     CURRENT MEDICATIONS:   Current Outpatient Medications  Medication Sig Dispense Refill   allopurinol (ZYLOPRIM) 300 MG tablet Take 300 mg by mouth daily.     atorvastatin (LIPITOR) 10 MG tablet Take 1 tablet by mouth daily.     calcium acetate (PHOSLO) 667 MG capsule Take by mouth.     glipiZIDE (GLUCOTROL XL) 10 MG 24 hr tablet Take 10 mg by mouth daily.     latanoprost (XALATAN) 0.005 % ophthalmic solution 1 drop at bedtime.     loperamide (IMODIUM) 2 MG capsule Initial dose 4 mg,  then 2 mg after each loose stool. Maximum of 8 caps per 24 hrs. 24 capsule 0   metoprolol succinate (TOPROL-XL) 50 MG 24 hr tablet Take 50 mg by mouth daily.     TRULICITY 1.5 JT/7.0VX SOPN Inject 1.5 mg into the skin once a week.     Vitamin D, Ergocalciferol, (DRISDOL) 1.25 MG (50000 UNIT) CAPS capsule Take by mouth.     No current facility-administered medications for this visit.    REVIEW OF SYSTEMS:   [X]  denotes positive finding, [ ]  denotes negative finding Cardiac  Comments:  Chest pain or chest pressure:    Shortness of breath upon exertion:    Short of breath when lying flat:    Irregular heart rhythm:        Vascular    Pain in calf, thigh, or hip brought on by ambulation:    Pain in feet at night that wakes you up from your sleep:     Blood clot in your veins:    Leg swelling:         Pulmonary    Oxygen at home:    Productive cough:     Wheezing:         Neurologic    Sudden weakness in arms or legs:     Sudden numbness in arms or legs:     Sudden onset  of difficulty speaking or slurred speech:    Temporary loss of vision in one eye:     Problems with dizziness:         Gastrointestinal    Blood in stool:     Vomited blood:         Genitourinary    Burning when urinating:     Blood in urine:        Psychiatric    Major depression:         Hematologic    Bleeding problems:    Problems with blood clotting too easily:        Skin    Rashes or ulcers:        Constitutional    Fever or chills:      PHYSICAL EXAM:   Vitals:   07/04/21 1449  BP: (!) 170/109  Pulse: 77  Resp: 20  Temp: 98.2 F (36.8 C)  SpO2: 97%  Weight: 236 lb 9.6 oz (107.3 kg)  Height: 5\' 10"  (1.778 m)    GENERAL: The patient is a well-nourished male, in no acute distress. The vital signs are documented above. CARDIAC: There is a regular rate and rhythm.  VASCULAR: Palpable left radial pulse MUSCULOSKELETAL: There are no major deformities or cyanosis. NEUROLOGIC: No  focal weakness or paresthesias are detected. SKIN: There are no ulcers or rashes noted. PSYCHIATRIC: The patient has a normal affect.  STUDIES:   I have reviewed his vascular studies with the following findings: +-----------------+-------------+----------+-------------------------------  +  Left Cephalic    Diameter (cm)Depth (cm)           Findings                +-----------------+-------------+----------+-------------------------------  +  Shoulder             0.12                                                 +-----------------+-------------+----------+-------------------------------  +  Prox upper arm       0.12                                                 +-----------------+-------------+----------+-------------------------------  +  Mid upper arm        0.13                                                 +-----------------+-------------+----------+-------------------------------  +  Dist upper arm       0.16                                                 +-----------------+-------------+----------+-------------------------------  +  Antecubital fossa    0.12                                                 +-----------------+-------------+----------+-------------------------------  +  Prox forearm         0.32               Arises from median cubital  vein  +-----------------+-------------+----------+-------------------------------  +  Mid forearm          0.37                                                 +-----------------+-------------+----------+-------------------------------  +  Dist forearm         0.29                                                 +-----------------+-------------+----------+-------------------------------  +   +-----------------+-------------+----------+--------+  Left Basilic     Diameter (cm)Depth (cm)Findings  +-----------------+-------------+----------+--------+  Prox  upper arm       0.87                         +-----------------+-------------+----------+--------+  Mid upper arm        0.34                         +-----------------+-------------+----------+--------+  Dist upper arm       0.60                         +-----------------+-------------+----------+--------+  Antecubital fossa    0.42                         +-----------------+-------------+----------+--------+  Prox forearm         0.37                         +-----------------+-------------+----------+--------+  MEDICAL ISSUES:   CKD 5: We discussed again proceeding with a staged left basilic vein fistula.  I will evaluate his cephalic vein at the wrist as he has a cephalic vein with into the basilic system.  He potentially could have a radiocephalic fistula.  We discussed the details of the surgery including the risks and benefits, the risk of steal, and the risk of maturity.  He is getting get this done within the next week or 2.  His son is also being evaluated for living related transplant    Leia Alf, MD, FACS [facility] [phone] Pager [phone]

## 2021-07-20 ENCOUNTER — Encounter (HOSPITAL_COMMUNITY): Payer: Self-pay | Admitting: Surgery

## 2021-07-20 ENCOUNTER — Other Ambulatory Visit: Payer: Self-pay

## 2021-07-20 NOTE — Anesthesia Preprocedure Evaluation (Addendum)
Anesthesia Evaluation  Patient identified by MRN, date of birth, ID band Patient awake    Reviewed: Allergy & Precautions, NPO status , Patient's Chart, lab work & pertinent test results, reviewed documented beta blocker date and time   Airway Mallampati: IV  TM Distance: >3 FB Neck ROM: Full    Dental  (+) Teeth Intact, Dental Advisory Given   Pulmonary sleep apnea and Continuous Positive Airway Pressure Ventilation ,    Pulmonary exam normal breath sounds clear to auscultation       Cardiovascular hypertension, Pt. on home beta blockers Normal cardiovascular exam Rhythm:Regular Rate:Normal     Neuro/Psych negative neurological ROS     GI/Hepatic negative GI ROS, Neg liver ROS,   Endo/Other  diabetes, Type 2, Oral Hypoglycemic Agents, Insulin DependentObesity   Renal/GU Renal InsufficiencyRenal disease     Musculoskeletal  (+) Arthritis ,   Abdominal   Peds  Hematology negative hematology ROS (+)   Anesthesia Other Findings   Reproductive/Obstetrics                            Anesthesia Physical Anesthesia Plan  ASA: 3  Anesthesia Plan: Regional and MAC   Post-op Pain Management: Regional block   Induction: Intravenous  PONV Risk Score and Plan: 1 and Propofol infusion, Treatment may vary due to age or medical condition and Midazolam  Airway Management Planned: Nasal Cannula and Natural Airway  Additional Equipment:   Intra-op Plan:   Post-operative Plan:   Informed Consent: I have reviewed the patients History and Physical, chart, labs and discussed the procedure including the risks, benefits and alternatives for the proposed anesthesia with the patient or authorized representative who has indicated his/her understanding and acceptance.     Dental advisory given  Plan Discussed with: CRNA  Anesthesia Plan Comments:        Anesthesia Quick Evaluation

## 2021-07-20 NOTE — Progress Notes (Signed)
PCP - Dr. Delaney Meigs- [facility]  Cardiologist - Denies  EP-Denies  Endocrine-Denies  Pulm-Denies  Chest x-ray - Denies  EKG - 07/21/21- Day of Surgery  Stress Test - Denies  ECHO - Denies  Cardiac Cath - Denies  AICD-na PM-na LOOP-na  Dialysis-Denies  Sleep Study - Yes- Positive CPAP - Yes  LABS- 07/21/21: I-Stat 8  ASA-Denies  ERAS- No  HA1C-05/17/21 (E): 5.8 Fasting Blood Sugar - unable to give, he has been in need of a new meter x2 weeks Checks Blood Sugar __1___ time a week  Anesthesia- No  Pt denies having chest pain, sob, or fever during the pre-op phone call. All instructions explained to the pt, with a verbal understanding of the material including: as of today, stop taking all Aspirin (unless instructed by your doctor) and Other Aspirin containing products, Vitamins, Fish oils, and Herbal medications. Also stop all NSAIDS i.e. Advil, Ibuprofen, Motrin, Aleve, Anaprox, Naproxen, BC, Goody Powders, and all  supplements.    WHAT DO I DO ABOUT MY DIABETES MEDICATION?  Do not take Glipizide the morning of surgery.  The day of surgery, do not take other diabetes injectables Trulicity (dulaglutide).  If your blood sugar is less than 70 mg/dL, you will need to treat for low blood sugar: Do not take insulin. Treat a low blood sugar (less than 70 mg/dL) with  cup of clear juice (cranberry or apple), 4 glucose tablets, OR glucose gel. Recheck blood sugar in 15 minutes after treatment (to make sure it is greater than 70 mg/dL). If your blood sugar is not greater than 70 mg/dL on recheck, call [phone]  for further instructions. Report your blood sugar to the short stay nurse when you get to Short Stay.  Reviewed and Endorsed by [facility] Patient Education Committee, August 2015   Pt also instructed to wear a mask and social distance if he goes out. The opportunity to ask questions was provided.    Coronavirus Screening  Have you experienced the following  symptoms:  Cough yes/no: No Fever (>100.78F)  yes/no: No Runny nose yes/no: No Sore throat yes/no: No Difficulty breathing/shortness of breath  yes/no: No  Have you or a family member traveled in the last 14 days and where? yes/no: No   If the patient indicates "YES" to the above questions, their PAT will be rescheduled to limit the exposure to others and, the surgeon will be notified. THE PATIENT WILL NEED TO BE ASYMPTOMATIC FOR 14 DAYS.   If the patient is not experiencing any of these symptoms, the PAT nurse will instruct them to NOT bring anyone with them to their appointment since they may have these symptoms or traveled as well.   Please remind your patients and families that hospital visitation restrictions are in effect and the importance of the restrictions.

## 2021-07-21 ENCOUNTER — Ambulatory Visit (HOSPITAL_COMMUNITY)
Admission: RE | Admit: 2021-07-21 | Discharge: 2021-07-21 | Disposition: A | Discharge status: Home / Self Care | Payer: BC Managed Care – PPO | Attending: Surgery | Admitting: Surgery

## 2021-07-21 ENCOUNTER — Ambulatory Visit (HOSPITAL_COMMUNITY): Payer: BC Managed Care – PPO | Admitting: Anesthesiology

## 2021-07-21 ENCOUNTER — Other Ambulatory Visit: Payer: Self-pay

## 2021-07-21 ENCOUNTER — Encounter (HOSPITAL_COMMUNITY)
Admission: RE | Disposition: A | Discharge status: Home / Self Care | Payer: Self-pay | Source: Home / Self Care | Attending: Surgery

## 2021-07-21 ENCOUNTER — Encounter (HOSPITAL_COMMUNITY): Payer: Self-pay | Admitting: Surgery

## 2021-07-21 DIAGNOSIS — R809 Proteinuria, unspecified: Secondary | ICD-10-CM | POA: Insufficient documentation

## 2021-07-21 DIAGNOSIS — N185 Chronic kidney disease, stage 5: Secondary | ICD-10-CM | POA: Insufficient documentation

## 2021-07-21 DIAGNOSIS — I120 Hypertensive chronic kidney disease with stage 5 chronic kidney disease or end stage renal disease: Secondary | ICD-10-CM | POA: Insufficient documentation

## 2021-07-21 DIAGNOSIS — N2581 Secondary hyperparathyroidism of renal origin: Secondary | ICD-10-CM | POA: Diagnosis not present

## 2021-07-21 DIAGNOSIS — E1122 Type 2 diabetes mellitus with diabetic chronic kidney disease: Secondary | ICD-10-CM | POA: Diagnosis not present

## 2021-07-21 DIAGNOSIS — N184 Chronic kidney disease, stage 4 (severe): Secondary | ICD-10-CM | POA: Diagnosis not present

## 2021-07-21 DIAGNOSIS — N189 Chronic kidney disease, unspecified: Secondary | ICD-10-CM

## 2021-07-21 HISTORY — PX: AV FISTULA PLACEMENT: SHX1204

## 2021-07-21 HISTORY — DX: Sleep apnea, unspecified: G47.30

## 2021-07-21 LAB — POCT I-STAT, CHEM 8
BUN: 78 mg/dL — ABNORMAL HIGH (ref 6–20)
Calcium, Ion: 0.99 mmol/L — ABNORMAL LOW (ref 1.15–1.40)
Chloride: 106 mmol/L (ref 98–111)
Creatinine, Ser: 8.8 mg/dL — ABNORMAL HIGH (ref 0.61–1.24)
Glucose, Bld: 115 mg/dL — ABNORMAL HIGH (ref 70–99)
HCT: 37 % — ABNORMAL LOW (ref 39.0–52.0)
Hemoglobin: 12.6 g/dL — ABNORMAL LOW (ref 13.0–17.0)
Potassium: 4.7 mmol/L (ref 3.5–5.1)
Sodium: 140 mmol/L (ref 135–145)
TCO2: 22 mmol/L (ref 22–32)

## 2021-07-21 LAB — GLUCOSE, CAPILLARY
Glucose-Capillary: 118 mg/dL — ABNORMAL HIGH (ref 70–99)
Glucose-Capillary: 125 mg/dL — ABNORMAL HIGH (ref 70–99)

## 2021-07-21 SURGERY — ARTERIOVENOUS (AV) FISTULA CREATION
Anesthesia: Monitor Anesthesia Care | Site: Arm Lower | Laterality: Left

## 2021-07-21 MED ORDER — FENTANYL CITRATE (PF) 100 MCG/2ML IJ SOLN
INTRAMUSCULAR | Status: DC | PRN
  Administered 2021-07-21 (×2): 25 ug via INTRAVENOUS
  Administered 2021-07-21: 50 ug via INTRAVENOUS

## 2021-07-21 MED ORDER — CEFAZOLIN SODIUM-DEXTROSE 2-4 GM/100ML-% IV SOLN
2.0000 g | INTRAVENOUS | Status: AC
  Administered 2021-07-21: 2 g via INTRAVENOUS
  Filled 2021-07-21: qty 100

## 2021-07-21 MED ORDER — PHENYLEPHRINE HCL-NACL 20-0.9 MG/250ML-% IV SOLN
INTRAVENOUS | Status: DC | PRN
  Administered 2021-07-21: 20 ug/min via INTRAVENOUS

## 2021-07-21 MED ORDER — PROPOFOL 10 MG/ML IV BOLUS
INTRAVENOUS | Status: DC | PRN
  Administered 2021-07-21: 40 mg via INTRAVENOUS

## 2021-07-21 MED ORDER — SODIUM CHLORIDE 0.9 % IV SOLN: INTRAVENOUS | Status: DC

## 2021-07-21 MED ORDER — ONDANSETRON HCL 4 MG/2ML IJ SOLN: 4.0000 mg | Freq: Once | INTRAMUSCULAR | Status: DC | PRN

## 2021-07-21 MED ORDER — PROPOFOL 500 MG/50ML IV EMUL
INTRAVENOUS | Status: DC | PRN
  Administered 2021-07-21: 100 ug/kg/min via INTRAVENOUS

## 2021-07-21 MED ORDER — LIDOCAINE-EPINEPHRINE (PF) 1.5 %-1:200000 IJ SOLN
INTRAMUSCULAR | Status: DC | PRN
  Administered 2021-07-21: 30 mL via PERINEURAL

## 2021-07-21 MED ORDER — ORAL CARE MOUTH RINSE: 15.0000 mL | Freq: Once | OROMUCOSAL | Status: AC

## 2021-07-21 MED ORDER — CHLORHEXIDINE GLUCONATE 4 % EX LIQD: 60.0000 mL | Freq: Once | CUTANEOUS | Status: DC

## 2021-07-21 MED ORDER — ACETAMINOPHEN 500 MG PO TABS
1000.0000 mg | ORAL_TABLET | Freq: Once | ORAL | Status: AC
  Administered 2021-07-21: 1000 mg via ORAL
  Filled 2021-07-21: qty 2

## 2021-07-21 MED ORDER — FENTANYL CITRATE (PF) 100 MCG/2ML IJ SOLN: 25.0000 ug | INTRAMUSCULAR | Status: DC | PRN

## 2021-07-21 MED ORDER — 0.9 % SODIUM CHLORIDE (POUR BTL) OPTIME
TOPICAL | Status: DC | PRN
  Administered 2021-07-21: 1000 mL

## 2021-07-21 MED ORDER — CHLORHEXIDINE GLUCONATE 0.12 % MT SOLN
15.0000 mL | Freq: Once | OROMUCOSAL | Status: AC
  Administered 2021-07-21: 15 mL via OROMUCOSAL
  Filled 2021-07-21: qty 15

## 2021-07-21 MED ORDER — MIDAZOLAM HCL 2 MG/2ML IJ SOLN
INTRAMUSCULAR | Status: AC
  Filled 2021-07-21: qty 2

## 2021-07-21 MED ORDER — FENTANYL CITRATE (PF) 250 MCG/5ML IJ SOLN
INTRAMUSCULAR | Status: AC
  Filled 2021-07-21: qty 5

## 2021-07-21 MED ORDER — OXYCODONE-ACETAMINOPHEN 5-325 MG PO TABS: 1.0000 | ORAL_TABLET | Freq: Four times a day (QID) | ORAL | 0 refills | Status: DC | PRN

## 2021-07-21 MED ORDER — HEPARIN 6000 UNIT IRRIGATION SOLUTION
Status: DC | PRN
  Administered 2021-07-21: 1

## 2021-07-21 MED ORDER — MIDAZOLAM HCL 5 MG/5ML IJ SOLN
INTRAMUSCULAR | Status: DC | PRN
  Administered 2021-07-21: 2 mg via INTRAVENOUS

## 2021-07-21 SURGICAL SUPPLY — 33 items
ADH SKN CLS APL DERMABOND .7 (GAUZE/BANDAGES/DRESSINGS) ×1
ARMBAND PINK RESTRICT EXTREMIT (MISCELLANEOUS) ×6 IMPLANT
BAG COUNTER SPONGE SURGICOUNT (BAG) ×2 IMPLANT
BAG SPNG CNTER NS LX DISP (BAG) ×1
BAG SURGICOUNT SPONGE COUNTING (BAG) ×1
CANISTER SUCT 3000ML PPV (MISCELLANEOUS) ×3 IMPLANT
CLIP VESOCCLUDE MED 6/CT (CLIP) ×3 IMPLANT
CLIP VESOCCLUDE SM WIDE 6/CT (CLIP) ×3 IMPLANT
COVER PROBE W GEL 5X96 (DRAPES) ×3 IMPLANT
DERMABOND ADVANCED (GAUZE/BANDAGES/DRESSINGS) ×2
DERMABOND ADVANCED .7 DNX12 (GAUZE/BANDAGES/DRESSINGS) ×1 IMPLANT
ELECT REM PT RETURN 9FT ADLT (ELECTROSURGICAL) ×3
ELECTRODE REM PT RTRN 9FT ADLT (ELECTROSURGICAL) ×1 IMPLANT
GLOVE SRG 8 PF TXTR STRL LF DI (GLOVE) ×1 IMPLANT
GLOVE SURG POLYISO LF SZ7.5 (GLOVE) ×3 IMPLANT
GLOVE SURG UNDER POLY LF SZ8 (GLOVE) ×3
GOWN STRL REUS W/ TWL LRG LVL3 (GOWN DISPOSABLE) ×2 IMPLANT
GOWN STRL REUS W/ TWL XL LVL3 (GOWN DISPOSABLE) ×1 IMPLANT
GOWN STRL REUS W/TWL LRG LVL3 (GOWN DISPOSABLE) ×6
GOWN STRL REUS W/TWL XL LVL3 (GOWN DISPOSABLE) ×3
HEMOSTAT SNOW SURGICEL 2X4 (HEMOSTASIS) IMPLANT
KIT BASIN OR (CUSTOM PROCEDURE TRAY) ×3 IMPLANT
KIT TURNOVER KIT B (KITS) ×3 IMPLANT
NS IRRIG 1000ML POUR BTL (IV SOLUTION) ×3 IMPLANT
PACK CV ACCESS (CUSTOM PROCEDURE TRAY) ×3 IMPLANT
PAD ARMBOARD 7.5X6 YLW CONV (MISCELLANEOUS) ×6 IMPLANT
SUT PROLENE 6 0 CC (SUTURE) ×3 IMPLANT
SUT VIC AB 3-0 SH 27 (SUTURE) ×3
SUT VIC AB 3-0 SH 27X BRD (SUTURE) ×1 IMPLANT
SUT VICRYL 4-0 PS2 18IN ABS (SUTURE) ×3 IMPLANT
TOWEL GREEN STERILE (TOWEL DISPOSABLE) ×3 IMPLANT
UNDERPAD 30X36 HEAVY ABSORB (UNDERPADS AND DIAPERS) ×3 IMPLANT
WATER STERILE IRR 1000ML POUR (IV SOLUTION) ×3 IMPLANT

## 2021-07-21 NOTE — Anesthesia Procedure Notes (Signed)
Anesthesia Regional Block: Supraclavicular block   Pre-Anesthetic Checklist: , timeout performed,  Correct Patient, Correct Site, Correct Laterality,  Correct Procedure, Correct Position, site marked,  Risks and benefits discussed,  Surgical consent,  Pre-op evaluation,  At surgeon's request and post-op pain management  Laterality: Left  Prep: chloraprep       Needles:  Injection technique: Single-shot  Needle Type: Echogenic Needle     Needle Length: 9cm  Needle Gauge: 21     Additional Needles:   Procedures:,,,, ultrasound used (permanent image in chart),,    Narrative:  Start time: 07/21/2021 7:20 AM End time: 07/21/2021 7:26 AM Injection made incrementally with aspirations every 5 mL.  Performed by: Personally  Anesthesiologist: Catalina Gravel, MD  Additional Notes: No pain on injection. No increased resistance to injection. Injection made in 5cc increments.  Good needle visualization.  Patient tolerated procedure well.

## 2021-07-21 NOTE — Op Note (Signed)
    Patient name: Joshua Mullins MRN: [id number] DOB: 1976-07-02 Sex: male  07/21/2021 Pre-operative Diagnosis: CKD 4 Post-operative diagnosis:  Same Surgeon:  [name] Assistants:  Leontine Locket, PA Procedure:   Left radio-cephalic fistula creation.  Anesthesia:  Regional Blood Loss:  minimal Specimens:  none  Findings: The patient had a 3 to 4 mm forearm cephalic vein which drained into the basilic system.  The radial artery was disease-free and 2-1/2 mm.  Indications: This is a 45 year old gentleman who comes in today for fistula creation  Procedure:  The patient was identified in the holding area and taken to [facility]  The patient was then placed supine on the table. regional anesthesia was administered.  The patient was prepped and draped in the usual sterile fashion.  A time out was called and antibiotics were administered.  A PA was necessary to expedite procedure and assist with technical details.  Ultrasound was used to evaluate the surface veins in the left arm.  There was an excellent appearing basilic vein beginning at the antecubital crease.  In addition there was a 3 to 4 mm forearm cephalic vein that drains into the basilic system.  I felt this was adequate for fistula.  A longitudinal incision was made in the distal forearm.  Cautery was used divide subcutaneous tissue.  I then exposed the cephalic vein throughout the length of the incision.  This was a 3 mm vein.  All side branches were ligated between silk ties.  The vein was marked for orientation.  I then dissected out the radial artery.  This was a disease-free 2.5 mm artery.  I then ligated the vein distally with a silk tie.  The vein distended nicely with heparin saline.  The radial artery was then occluded with vascular clamps and a #11 blade was used to make an arteriotomy which was extended longitudinally with Potts scissors.  The vein was cut the appropriate length and spatulated but the size the arteriotomy.   A running anastomosis was created with 6-0 Prolene.  Just prior to completion, the appropriate flushing maneuvers were performed and the anastomosis was completed.  The clamps were then released.  The vein distended nicely.  I inspected the course of the vein to make sure there were no kinks.  I ligated all visible branches.  The wound was then irrigated.  Hemostasis was achieved.  The incision was closed with 2 layers of Vicryl followed by Dermabond.  There were no immediate complications.   Disposition: To PACU stable.   Theotis Burrow, M.D., [facility] [place] Office: [phone] Pager:  [phone] CKD [email or web address]

## 2021-07-21 NOTE — Anesthesia Procedure Notes (Signed)
Procedure Name: MAC Date/Time: 07/21/2021 7:38 AM Performed by: Erick Colace, CRNA Pre-anesthesia Checklist: Patient identified, Emergency Drugs available, Suction available and Timeout performed Patient Re-evaluated:Patient Re-evaluated prior to induction Oxygen Delivery Method: Simple face mask Preoxygenation: Pre-oxygenation with 100% oxygen Induction Type: IV induction Ventilation: Oral airway inserted - appropriate to patient size and Nasal airway inserted- appropriate to patient size

## 2021-07-21 NOTE — Anesthesia Postprocedure Evaluation (Signed)
Anesthesia Post Note  Patient: Joshua Mullins  Procedure(s) Performed: LEFT ARM FISTULA CREATION (Left: Arm Lower)     Patient location during evaluation: PACU Anesthesia Type: Regional Level of consciousness: awake and alert, awake and oriented Pain management: pain level controlled Vital Signs Assessment: post-procedure vital signs reviewed and stable Respiratory status: spontaneous breathing, nonlabored ventilation and respiratory function stable Cardiovascular status: stable and blood pressure returned to baseline Postop Assessment: no apparent nausea or vomiting Anesthetic complications: no   No notable events documented.  Last Vitals:  Vitals:   07/21/21 0920 07/21/21 0934  BP: (!) 142/105 (!) 142/97  Pulse: 89 92  Resp: 18 16  Temp:  36.4 C  SpO2: 100% 100%    Last Pain:  Vitals:   07/21/21 0934  TempSrc:   PainSc: 0-No pain                 Catalina Gravel

## 2021-07-21 NOTE — Discharge Instructions (Signed)
   [facility]  Discharge Instructions  AV Fistula or Graft Surgery for Dialysis Access  Please refer to the following instructions for your post-procedure care. Your surgeon or physician assistant will discuss any changes with you.  Activity  You may drive the day following your surgery, if you are comfortable and no longer taking prescription pain medication. Resume full activity as the soreness in your incision resolves.  Bathing/Showering  You may shower after you go home. Keep your incision dry for 48 hours. Do not soak in a bathtub, hot tub, or swim until the incision heals completely. You may not shower if you have a hemodialysis catheter.  Incision Care  Clean your incision with mild soap and water after 48 hours. Pat the area dry with a clean towel. You do not need a bandage unless otherwise instructed. Do not apply any ointments or creams to your incision. You may have skin glue on your incision. Do not peel it off. It will come off on its own in about one week. Your arm may swell a bit after surgery. To reduce swelling use pillows to elevate your arm so it is above your heart. Your doctor will tell you if you need to lightly wrap your arm with an ACE bandage.  Diet  Resume your normal diet. There are not special food restrictions following this procedure. In order to heal from your surgery, it is CRITICAL to get adequate nutrition. Your body requires vitamins, minerals, and protein. Vegetables are the best source of vitamins and minerals. Vegetables also provide the perfect balance of protein. Processed food has little nutritional value, so try to avoid this.  Medications  Resume taking all of your medications. If your incision is causing pain, you may take over-the counter pain relievers such as acetaminophen (Tylenol). If you were prescribed a stronger pain medication, please be aware these medications can cause nausea and constipation. Prevent  nausea by taking the medication with a snack or meal. Avoid constipation by drinking plenty of fluids and eating foods with high amount of fiber, such as fruits, vegetables, and grains.  Do not take Tylenol if you are taking prescription pain medications.  Follow up Your surgeon may want to see you in the office following your access surgery. If so, this will be arranged at the time of your surgery.  Please call us immediately for any of the following conditions:  Increased pain, redness, drainage (pus) from your incision site Fever of 101 degrees or higher Severe or worsening pain at your incision site Hand pain or numbness.  Reduce your risk of vascular disease:  Stop smoking. If you would like help, call [facility] at 1-800-QUIT-NOW 469-531-5159) or [place] at [place] your cholesterol Maintain a desired weight Control your diabetes Keep your blood pressure down  Dialysis  It will take several weeks to several months for your new dialysis access to be ready for use. Your surgeon will determine when it is okay to use it. Your nephrologist will continue to direct your dialysis. You can continue to use your Permcath until your new access is ready for use.   07/21/2021 NHAT HEARNE [id number] [birth year]  Surgeon(s): Serafina Mitchell, MD  Procedure(s): Creation left radial cephalic AV fistula  x Do not stick fistula for 12 weeks    If you have any questions, please call the office at [phone].

## 2021-07-21 NOTE — Interval H&P Note (Signed)
History and Physical Interval Note:  07/21/2021 7:15 AM  Joshua Mullins  has presented today for surgery, with the diagnosis of CKD V.  The various methods of treatment have been discussed with the patient and family. After consideration of risks, benefits and other options for treatment, the patient has consented to  Procedure(s): LEFT ARM FISTULA CREATION (Left) as a surgical intervention.  The patient's history has been reviewed, patient examined, no change in status, stable for surgery.  I have reviewed the patient's chart and labs.  Questions were answered to the patient's satisfaction.     [name]

## 2021-07-21 NOTE — Transfer of Care (Signed)
Immediate Anesthesia Transfer of Care Note  Patient: Joshua Mullins  Procedure(s) Performed: LEFT ARM FISTULA CREATION (Left: Arm Lower)  Patient Location: PACU  Anesthesia Type:MAC and Regional  Level of Consciousness: awake  Airway & Oxygen Therapy: Patient Spontanous Breathing  Post-op Assessment: Report given to RN and Post -op Vital signs reviewed and stable  Post vital signs: Reviewed and stable  Last Vitals:  Vitals Value Taken Time  BP 138/102 07/21/21 0904  Temp    Pulse 101 07/21/21 0905  Resp 19 07/21/21 0905  SpO2 100 % 07/21/21 0905  Vitals shown include unvalidated device data.  Last Pain:  Vitals:   07/21/21 0610  TempSrc: Oral  PainSc:       Patients Stated Pain Goal: 3 (16/10/96 0454)  Complications: No notable events documented.

## 2021-07-22 ENCOUNTER — Encounter (HOSPITAL_COMMUNITY): Payer: Self-pay | Admitting: Surgery

## 2021-07-25 ENCOUNTER — Telehealth: Payer: Self-pay

## 2021-07-25 NOTE — Telephone Encounter (Signed)
Spoke to Joshua Mullins after his wife [name] called [place] and left VM regarding Joshua Mullins's L thumb feeling numb s/p surgery. Joshua Mullins feels the thumb numbness is improving since surgery. He denies any redness, swelling, coolness, pain and is aware if he observes any of those to call us back. Joshua Mullins is also aware to call us back if numbness worsens or does not continue to resolve. He verbalized understanding and has no further questions/concerns at this time.

## 2021-08-10 ENCOUNTER — Other Ambulatory Visit: Payer: Self-pay | Admitting: *Deleted

## 2021-08-10 DIAGNOSIS — N184 Chronic kidney disease, stage 4 (severe): Secondary | ICD-10-CM

## 2021-08-16 DIAGNOSIS — N185 Chronic kidney disease, stage 5: Secondary | ICD-10-CM | POA: Insufficient documentation

## 2021-08-29 ENCOUNTER — Encounter (HOSPITAL_COMMUNITY): Payer: BC Managed Care – PPO

## 2021-09-01 ENCOUNTER — Encounter (HOSPITAL_COMMUNITY): Payer: BC Managed Care – PPO

## 2021-09-19 ENCOUNTER — Ambulatory Visit (INDEPENDENT_AMBULATORY_CARE_PROVIDER_SITE_OTHER): Payer: BC Managed Care – PPO | Admitting: Physician Assistant

## 2021-09-19 ENCOUNTER — Ambulatory Visit (HOSPITAL_COMMUNITY)
Admission: RE | Admit: 2021-09-19 | Discharge: 2021-09-19 | Disposition: A | Discharge status: Home / Self Care | Payer: BC Managed Care – PPO | Source: Ambulatory Visit | Attending: Surgery | Admitting: Surgery

## 2021-09-19 ENCOUNTER — Other Ambulatory Visit: Payer: Self-pay

## 2021-09-19 VITALS — BP 129/75 | HR 93 | Temp 98.0°F | Resp 156 | Ht 70.0 in | Wt 228.0 lb

## 2021-09-19 DIAGNOSIS — N184 Chronic kidney disease, stage 4 (severe): Secondary | ICD-10-CM | POA: Insufficient documentation

## 2021-09-19 NOTE — Progress Notes (Signed)
Office Note     CC:  follow up Requesting Provider:  Bernerd Limbo, MD  HPI: Joshua Mullins is a 46 y.o. (Sep 17, 1975) male who presents status post left radiocephalic fistula creation by Dr. Trula Slade on 07/21/2021.  He is not yet on hemodialysis.  He is followed by Dr. Theador Hawthorne.  He is seen every 4 to 6 weeks for evaluation of renal function.  He denies any steal symptoms in left hand.  He does have mild numbness of his thumb however this is tolerable.  He believes his incision is well-healed.  Patient states he is being evaluated for kidney transplantation by the [facility] transplant team.  His oldest son has agreed to be the donor.   Past Medical History:  Diagnosis Date   Chronic kidney disease    Diabetes mellitus without complication (HCC)    Type II   Hyperlipidemia    Hypertension    Sleep apnea    Uses a cpap    Past Surgical History:  Procedure Laterality Date   AV FISTULA PLACEMENT Left 07/21/2021   Procedure: LEFT ARM FISTULA CREATION;  Surgeon: Serafina Mitchell, MD;  Location: [facility] OR;  Service: Vascular;  Laterality: Left;    Social History   Socioeconomic History   Marital status: Married    Spouse name: Not on file   Number of children: Not on file   Years of education: Not on file   Highest education level: Not on file  Occupational History   Not on file  Tobacco Use   Smoking status: Never   Smokeless tobacco: Never  Vaping Use   Vaping Use: Never used  Substance and Sexual Activity   Alcohol use: No   Drug use: No   Sexual activity: Not on file  Other Topics Concern   Not on file  Social History Narrative   Not on file   Social Determinants of Health   Financial Resource Strain: Not on file  Food Insecurity: Not on file  Transportation Needs: Not on file  Physical Activity: Not on file  Stress: Not on file  Social Connections: Not on file  Intimate Partner Violence: Not on file   History reviewed. No pertinent family  history.  Current Outpatient Medications  Medication Sig Dispense Refill   allopurinol (ZYLOPRIM) 300 MG tablet Take 300 mg by mouth daily.     atorvastatin (LIPITOR) 10 MG tablet Take 10 mg by mouth daily.     calcium acetate (PHOSLO) 667 MG capsule Take 1,334 mg by mouth 3 (three) times daily with meals.     glipiZIDE (GLUCOTROL XL) 10 MG 24 hr tablet Take 10 mg by mouth daily.     latanoprost (XALATAN) 0.005 % ophthalmic solution Place 1 drop into both eyes at bedtime.     metoprolol succinate (TOPROL-XL) 100 MG 24 hr tablet Take 100 mg by mouth daily.     TRULICITY 1.5 ZO/1.0RU SOPN Inject 1.5 mg into the skin once a week.     Vitamin D, Ergocalciferol, (DRISDOL) 1.25 MG (50000 UNIT) CAPS capsule Take 50,000 Units by mouth every 7 (seven) days.     loperamide (IMODIUM) 2 MG capsule Initial dose 4 mg, then 2 mg after each loose stool. Maximum of 8 caps per 24 hrs. (Patient not taking: Reported on 07/18/2021) 24 capsule 0   oxyCODONE-acetaminophen (PERCOCET) 5-325 MG tablet Take 1 tablet by mouth every 6 (six) hours as needed for severe pain. (Patient not taking: Reported on 09/19/2021) 8 tablet  0   No current facility-administered medications for this visit.    Allergies  Allergen Reactions   Amlodipine Swelling   Lisinopril Swelling   Shellfish Allergy Swelling     REVIEW OF SYSTEMS:   [X]  denotes positive finding, [ ]  denotes negative finding Cardiac  Comments:  Chest pain or chest pressure:    Shortness of breath upon exertion:    Short of breath when lying flat:    Irregular heart rhythm:        Vascular    Pain in calf, thigh, or hip brought on by ambulation:    Pain in feet at night that wakes you up from your sleep:     Blood clot in your veins:    Leg swelling:         Pulmonary    Oxygen at home:    Productive cough:     Wheezing:         Neurologic    Sudden weakness in arms or legs:     Sudden numbness in arms or legs:     Sudden onset of difficulty  speaking or slurred speech:    Temporary loss of vision in one eye:     Problems with dizziness:         Gastrointestinal    Blood in stool:     Vomited blood:         Genitourinary    Burning when urinating:     Blood in urine:        Psychiatric    Major depression:         Hematologic    Bleeding problems:    Problems with blood clotting too easily:        Skin    Rashes or ulcers:        Constitutional    Fever or chills:      PHYSICAL EXAMINATION:  Vitals:   09/19/21 1505  BP: 129/75  Pulse: 93  Resp: (!) 156  Temp: 98 F (36.7 C)  TempSrc: Temporal  SpO2: 98%  Weight: 228 lb (103.4 kg)  Height: 5\' 10"  (1.778 m)    General:  WDWN in NAD; vital signs documented above Gait: Not observed HENT: WNL, normocephalic Pulmonary: normal non-labored breathing , without Rales, rhonchi,  wheezing Cardiac: regular HR Abdomen: soft, NT, no masses Skin: without rashes Extremities: Palpable thrill in radiocephalic fistula through most of the forearm however more difficult to feel as you approach the [facility] fossa Musculoskeletal: no muscle wasting or atrophy  Neurologic: A&O X 3;  No focal weakness or paresthesias are detected Psychiatric:  The pt has Normal affect.   Non-Invasive Vascular Imaging:   OUTFLOW VEIN PSV (cm/s) Diameter (cm) Depth (cm)           Describe               +------------+----------+-------------+----------+-------------------------  ----+   AC Fossa         29         0.74         0.71               Basilic               +------------+----------+-------------+----------+-------------------------  ----+   Prox Forearm    192         0.59         0.52        competing branch and  Connection to brachial  veins    +------------+----------+-------------+----------+-------------------------  ----+   Mid Forearm     136         0.46         0.43          competing branch            +------------+----------+-------------+----------+-------------------------  ----+   Dist Forearm    576         0.32         0.28                       ASSESSMENT/PLAN:: 46 y.o. male status post left radiocephalic fistula creation   -Patent left radiocephalic fistula with palpable thrill through the mid forearm however becomes more difficult to feel as you approach the [facility] fossa -Fistula duplex demonstrates competing branches in the mid and proximal forearm -Recheck fistula duplex in 3 to 4 weeks.  Findings of duplex were discussed with the patient and he is aware he may require sidebranch ligation versus conversion to brachiobasilic fistula   Dagoberto Ligas, PA-C [facility] [phone]  Clinic MD:   Carlis Abbott on call

## 2021-09-22 ENCOUNTER — Other Ambulatory Visit: Payer: Self-pay | Admitting: *Deleted

## 2021-09-22 DIAGNOSIS — N184 Chronic kidney disease, stage 4 (severe): Secondary | ICD-10-CM

## 2021-10-17 ENCOUNTER — Ambulatory Visit: Payer: BC Managed Care – PPO

## 2021-10-17 ENCOUNTER — Encounter (HOSPITAL_COMMUNITY): Payer: BC Managed Care – PPO

## 2021-12-12 ENCOUNTER — Other Ambulatory Visit: Payer: Self-pay

## 2021-12-12 ENCOUNTER — Encounter: Payer: Self-pay | Admitting: Surgery

## 2021-12-12 ENCOUNTER — Ambulatory Visit: Payer: BC Managed Care – PPO | Admitting: Surgery

## 2021-12-12 ENCOUNTER — Ambulatory Visit (HOSPITAL_COMMUNITY)
Admission: RE | Admit: 2021-12-12 | Discharge: 2021-12-12 | Disposition: A | Discharge status: Home / Self Care | Payer: BC Managed Care – PPO | Source: Ambulatory Visit | Attending: Surgery | Admitting: Surgery

## 2021-12-12 VITALS — BP 146/91 | HR 82 | Temp 98.2°F | Resp 20 | Ht 70.0 in | Wt 230.7 lb

## 2021-12-12 DIAGNOSIS — N184 Chronic kidney disease, stage 4 (severe): Secondary | ICD-10-CM

## 2021-12-12 NOTE — H&P (View-Only) (Signed)
[facility] [place]  Patient name: Joshua Mullins MRN: [id number] DOB: 06/19/1976 Sex: male   REASON FOR VISIT:    Follow up   HISOTRY OF PRESENT ILLNESS:    Joshua Mullins is a 46 y.o. male who is status post left radiocephalic fistula creation on 07/21/2021.  He is not yet on dialysis.  He was evaluated at the end of January.  He did not have any symptoms of steal.  The fistula was palpable through the mid forearm however it became difficult to feel up to the antecubital crease.  Duplex revealed several competing branches.  He is back for follow-up.   PAST MEDICAL HISTORY:   Past Medical History:  Diagnosis Date   Chronic kidney disease    Diabetes mellitus without complication ([place])    Type II   Hyperlipidemia    Hypertension    Sleep apnea    Uses a cpap     FAMILY HISTORY:   History reviewed. No pertinent family history.  SOCIAL HISTORY:   Social History   Tobacco Use   Smoking status: Never   Smokeless tobacco: Never  Substance Use Topics   Alcohol use: No     ALLERGIES:   Allergies  Allergen Reactions   Amlodipine Swelling   Lisinopril Swelling   Shellfish Allergy Swelling     CURRENT MEDICATIONS:   Current Outpatient Medications  Medication Sig Dispense Refill   allopurinol (ZYLOPRIM) 300 MG tablet Take 300 mg by mouth daily.     atorvastatin (LIPITOR) 10 MG tablet Take 10 mg by mouth daily.     calcium acetate (PHOSLO) 667 MG capsule Take 1,334 mg by mouth 3 (three) times daily with meals.     glipiZIDE (GLUCOTROL XL) 10 MG 24 hr tablet Take 10 mg by mouth daily.     hydrALAZINE (APRESOLINE) 50 MG tablet Take 50 mg by mouth 2 (two) times daily.     latanoprost (XALATAN) 0.005 % ophthalmic solution Place 1 drop into both eyes at bedtime.     metoprolol succinate (TOPROL-XL) 100 MG 24 hr tablet Take 100 mg by mouth daily.     TRULICITY 1.5 MO/2.9UT SOPN Inject 1.5 mg into the  skin once a week.     Vitamin D, Ergocalciferol, (DRISDOL) 1.25 MG (50000 UNIT) CAPS capsule Take 50,000 Units by mouth every 7 (seven) days.     loperamide (IMODIUM) 2 MG capsule Initial dose 4 mg, then 2 mg after each loose stool. Maximum of 8 caps per 24 hrs. (Patient not taking: Reported on 07/18/2021) 24 capsule 0   oxyCODONE-acetaminophen (PERCOCET) 5-325 MG tablet Take 1 tablet by mouth every 6 (six) hours as needed for severe pain. (Patient not taking: Reported on 09/19/2021) 8 tablet 0   No current facility-administered medications for this visit.    REVIEW OF SYSTEMS:   [X]  denotes positive finding, [ ]  denotes negative finding Cardiac  Comments:  Chest pain or chest pressure:    Shortness of breath upon exertion:    Short of breath when lying flat:    Irregular heart rhythm:        Vascular    Pain in calf, thigh, or hip brought on by ambulation:    Pain in feet at night that wakes you up from your sleep:     Blood clot in your veins:    Leg swelling:         Pulmonary    Oxygen at home:  Productive cough:     Wheezing:         Neurologic    Sudden weakness in arms or legs:     Sudden numbness in arms or legs:     Sudden onset of difficulty speaking or slurred speech:    Temporary loss of vision in one eye:     Problems with dizziness:         Gastrointestinal    Blood in stool:     Vomited blood:         Genitourinary    Burning when urinating:     Blood in urine:        Psychiatric    Major depression:         Hematologic    Bleeding problems:    Problems with blood clotting too easily:        Skin    Rashes or ulcers:        Constitutional    Fever or chills:      PHYSICAL EXAM:   Vitals:   12/12/21 0904  BP: (!) 146/91  Pulse: 82  Resp: 20  Temp: 98.2 F (36.8 C)  SpO2: 98%  Weight: 230 lb 11.2 oz (104.6 kg)  Height: 5\' 10"  (1.778 m)    GENERAL: The patient is a well-nourished male, in no acute distress. The vital signs are  documented above. CARDIAC: There is a regular rate and rhythm.  VASCULAR: Palpable thrill within the fistula in the lower part of the forearm.  I could not palpate it higher up.  I did evaluate this with SonoSite.  The vein appears to be adequate however there are multiple side branches. PULMONARY: Non-labored respirations ABDOMEN: Soft and non-tender with normal pitched bowel sounds.  MUSCULOSKELETAL: There are no major deformities or cyanosis. NEUROLOGIC: No focal weakness or paresthesias are detected. SKIN: There are no ulcers or rashes noted. PSYCHIATRIC: The patient has a normal affect.  STUDIES:   I have reviewed his ultrasound with the following findings: OUTFLOW VEINPSV (cm/s)Diameter (cm)Depth (cm)           Describe              +------------+----------+-------------+-----------+------------------------  ----+  AC Fossa       175        0.56        0.36             branching              +------------+----------+-------------+-----------+------------------------  ----+  Prox Forearm    88        0.49        0.43                                    +------------+----------+-------------+-----------+------------------------  ----+  Mid Forearm >848 / 295 0.18 / 0.51 0.42 / 0.38Narrowing, competing  branch.                                                     Mid to distal fa.          +------------+----------+-------------+-----------+------------------------  ----+  Dist Forearm   341        0.38        0.36                                    +------------+----------+-------------+-----------+------------------------  ----+  MEDICAL ISSUES:   CKD4: The patient has a maturing left radiocephalic fistula.  I believe this is secondary to several competing branches.  Therefore I have recommended branch ligation.  I also may have to elevate the fistula in the upper part of the forearm.  He will get this scheduled in the near  future.    Leia Alf, MD, FACS [facility] [phone] Pager [phone]

## 2021-12-12 NOTE — Progress Notes (Signed)
? ?[facility] ? ?Patient name: Joshua Mullins MRN: [id number] DOB: 10-Sep-1975 Sex: male ? ? ?REASON FOR VISIT:  ? ? ?Follow up  ? ?HISOTRY OF PRESENT ILLNESS:  ? ? ?TAB Joshua Mullins is a 46 y.o. male who is status post left radiocephalic fistula creation on 07/21/2021.  He is not yet on dialysis.  He was evaluated at the end of January.  He did not have any symptoms of steal.  The fistula was palpable through the mid forearm however it became difficult to feel up to the antecubital crease.  Duplex revealed several competing branches.  He is back for follow-up. ? ? ?PAST MEDICAL HISTORY:  ? ?Past Medical History:  ?Diagnosis Date  ? Chronic kidney disease   ? Diabetes mellitus without complication ([place])   ? Type II  ? Hyperlipidemia   ? Hypertension   ? Sleep apnea   ? Uses a cpap  ? ? ? ?FAMILY HISTORY:  ? ?History reviewed. No pertinent family history. ? ?SOCIAL HISTORY:  ? ?Social History  ? ?Tobacco Use  ? Smoking status: Never  ? Smokeless tobacco: Never  ?Substance Use Topics  ? Alcohol use: No  ? ? ? ?ALLERGIES:  ? ?Allergies  ?Allergen Reactions  ? Amlodipine Swelling  ? Lisinopril Swelling  ? Shellfish Allergy Swelling  ? ? ? ?CURRENT MEDICATIONS:  ? ?Current Outpatient Medications  ?Medication Sig Dispense Refill  ? allopurinol (ZYLOPRIM) 300 MG tablet Take 300 mg by mouth daily.    ? atorvastatin (LIPITOR) 10 MG tablet Take 10 mg by mouth daily.    ? calcium acetate (PHOSLO) 667 MG capsule Take 1,334 mg by mouth 3 (three) times daily with meals.    ? glipiZIDE (GLUCOTROL XL) 10 MG 24 hr tablet Take 10 mg by mouth daily.    ? hydrALAZINE (APRESOLINE) 50 MG tablet Take 50 mg by mouth 2 (two) times daily.    ? latanoprost (XALATAN) 0.005 % ophthalmic solution Place 1 drop into both eyes at bedtime.    ? metoprolol succinate (TOPROL-XL) 100 MG 24 hr tablet Take 100 mg by mouth daily.    ? TRULICITY 1.5 ZC/5.8IF SOPN Inject 1.5 mg into the  skin once a week.    ? Vitamin D, Ergocalciferol, (DRISDOL) 1.25 MG (50000 UNIT) CAPS capsule Take 50,000 Units by mouth every 7 (seven) days.    ? loperamide (IMODIUM) 2 MG capsule Initial dose 4 mg, then 2 mg after each loose stool. Maximum of 8 caps per 24 hrs. (Patient not taking: Reported on 07/18/2021) 24 capsule 0  ? oxyCODONE-acetaminophen (PERCOCET) 5-325 MG tablet Take 1 tablet by mouth every 6 (six) hours as needed for severe pain. (Patient not taking: Reported on 09/19/2021) 8 tablet 0  ? ?No current facility-administered medications for this visit.  ? ? ?REVIEW OF SYSTEMS:  ? ?[X]  denotes positive finding, [ ]  denotes negative finding ?Cardiac  Comments:  ?Chest pain or chest pressure:    ?Shortness of breath upon exertion:    ?Short of breath when lying flat:    ?Irregular heart rhythm:    ?    ?Vascular    ?Pain in calf, thigh, or hip brought on by ambulation:    ?Pain in feet at night that wakes you up from your sleep:     ?Blood clot in your veins:    ?Leg swelling:     ?    ?Pulmonary    ?Oxygen at home:    ?  Productive cough:     ?Wheezing:     ?    ?Neurologic    ?Sudden weakness in arms or legs:     ?Sudden numbness in arms or legs:     ?Sudden onset of difficulty speaking or slurred speech:    ?Temporary loss of vision in one eye:     ?Problems with dizziness:     ?    ?Gastrointestinal    ?Blood in stool:     ?Vomited blood:     ?    ?Genitourinary    ?Burning when urinating:     ?Blood in urine:    ?    ?Psychiatric    ?Major depression:     ?    ?Hematologic    ?Bleeding problems:    ?Problems with blood clotting too easily:    ?    ?Skin    ?Rashes or ulcers:    ?    ?Constitutional    ?Fever or chills:    ? ? ?PHYSICAL EXAM:  ? ?Vitals:  ? 12/12/21 0904  ?BP: (!) 146/91  ?Pulse: 82  ?Resp: 20  ?Temp: 98.2 ?F (36.8 ?C)  ?SpO2: 98%  ?Weight: 230 lb 11.2 oz (104.6 kg)  ?Height: 5\' 10"  (1.778 m)  ? ? ?GENERAL: The patient is a well-nourished male, in no acute distress. The vital signs are  documented above. ?CARDIAC: There is a regular rate and rhythm.  ?VASCULAR: Palpable thrill within the fistula in the lower part of the forearm.  I could not palpate it higher up.  I did evaluate this with SonoSite.  The vein appears to be adequate however there are multiple side branches. ?PULMONARY: Non-labored respirations ?ABDOMEN: Soft and non-tender with normal pitched bowel sounds.  ?MUSCULOSKELETAL: There are no major deformities or cyanosis. ?NEUROLOGIC: No focal weakness or paresthesias are detected. ?SKIN: There are no ulcers or rashes noted. ?PSYCHIATRIC: The patient has a normal affect. ? ?STUDIES:  ? ?I have reviewed his ultrasound with the following findings: ?OUTFLOW VEINPSV (cm/s)Diameter (cm)Depth (cm)           Describe        ?      ?+------------+----------+-------------+-----------+------------------------  ?----+  ?AC Fossa       175        0.56        0.36             branching        ?      ?+------------+----------+-------------+-----------+------------------------  ?----+  ?Prox Forearm    88        0.49        0.43                              ?      ?+------------+----------+-------------+-----------+------------------------  ?----+  ?Mid Forearm >848 / 295 0.18 / 0.51 0.42 / 0.38Narrowing, competing  ?branch.  ?                                                   Mid to distal fa.    ?      ?+------------+----------+-------------+-----------+------------------------  ?----+  ?Dist Forearm   341        0.38        0.36                              ?      ?+------------+----------+-------------+-----------+------------------------  ?----+  ?  MEDICAL ISSUES:  ? ?CKD4: The patient has a maturing left radiocephalic fistula.  I believe this is secondary to several competing branches.  Therefore I have recommended branch ligation.  I also may have to elevate the fistula in the upper part of the forearm.  He will get this scheduled in the near  future. ? ? ? ?[name], IV, MD, FACS ?[facility] ?Tel [phone] ?Pager [phone]  ?

## 2022-01-03 ENCOUNTER — Other Ambulatory Visit: Payer: Self-pay

## 2022-01-03 ENCOUNTER — Encounter (HOSPITAL_COMMUNITY): Payer: Self-pay | Admitting: Surgery

## 2022-01-03 NOTE — Progress Notes (Signed)
Mr. Leaming denies chest pain or shortness of breath.  Patient denies having any s/s of Covid in his household and denies any known exposure to Covid.  ? ?PCP is Dr. Maebelle Munroe. ? ?Mr. Wegner has type II diabetes. Last A1C was 5.2 drawn on 12/20/21, at [facility]. I instructed Mr. Sikora to not take Glipizide on Thursday am.   Mr. Koegel reports that he checks CBG weekly it runs 85- 90.I instructed patient to check CBG after awaking and every 2 hours until arrival  to the hospital.  ?I Instructed  Mr. Porreca if CBG is less than 70 to take 4 Glucose Tablets or 1 tube of Glucose Gel or 1/2 cup of a clear juice. Recheck CBG in 15 minutes if CBG is not over 70 call, pre- op desk at [phone] for further instructions.  ?

## 2022-01-05 ENCOUNTER — Ambulatory Visit (HOSPITAL_COMMUNITY): Payer: BC Managed Care – PPO | Admitting: Anesthesiology

## 2022-01-05 ENCOUNTER — Ambulatory Visit (HOSPITAL_COMMUNITY)
Admission: RE | Admit: 2022-01-05 | Discharge: 2022-01-05 | Disposition: A | Discharge status: Home / Self Care | Payer: BC Managed Care – PPO | Attending: Surgery | Admitting: Surgery

## 2022-01-05 ENCOUNTER — Encounter (HOSPITAL_COMMUNITY): Payer: Self-pay | Admitting: Surgery

## 2022-01-05 ENCOUNTER — Other Ambulatory Visit: Payer: Self-pay

## 2022-01-05 ENCOUNTER — Encounter (HOSPITAL_COMMUNITY)
Admission: RE | Disposition: A | Discharge status: Home / Self Care | Payer: Self-pay | Source: Home / Self Care | Attending: Surgery

## 2022-01-05 DIAGNOSIS — G4730 Sleep apnea, unspecified: Secondary | ICD-10-CM | POA: Diagnosis not present

## 2022-01-05 DIAGNOSIS — E669 Obesity, unspecified: Secondary | ICD-10-CM | POA: Insufficient documentation

## 2022-01-05 DIAGNOSIS — Z794 Long term (current) use of insulin: Secondary | ICD-10-CM | POA: Diagnosis not present

## 2022-01-05 DIAGNOSIS — N182 Chronic kidney disease, stage 2 (mild): Secondary | ICD-10-CM | POA: Diagnosis not present

## 2022-01-05 DIAGNOSIS — N185 Chronic kidney disease, stage 5: Secondary | ICD-10-CM

## 2022-01-05 DIAGNOSIS — Z79899 Other long term (current) drug therapy: Secondary | ICD-10-CM | POA: Insufficient documentation

## 2022-01-05 DIAGNOSIS — E1122 Type 2 diabetes mellitus with diabetic chronic kidney disease: Secondary | ICD-10-CM | POA: Insufficient documentation

## 2022-01-05 DIAGNOSIS — I129 Hypertensive chronic kidney disease with stage 1 through stage 4 chronic kidney disease, or unspecified chronic kidney disease: Secondary | ICD-10-CM | POA: Insufficient documentation

## 2022-01-05 DIAGNOSIS — T82591A Other mechanical complication of surgically created arteriovenous shunt, initial encounter: Secondary | ICD-10-CM | POA: Diagnosis present

## 2022-01-05 DIAGNOSIS — Z7984 Long term (current) use of oral hypoglycemic drugs: Secondary | ICD-10-CM | POA: Diagnosis not present

## 2022-01-05 DIAGNOSIS — Z6833 Body mass index (BMI) 33.0-33.9, adult: Secondary | ICD-10-CM | POA: Insufficient documentation

## 2022-01-05 DIAGNOSIS — Y838 Other surgical procedures as the cause of abnormal reaction of the patient, or of later complication, without mention of misadventure at the time of the procedure: Secondary | ICD-10-CM | POA: Diagnosis not present

## 2022-01-05 DIAGNOSIS — T82590A Other mechanical complication of surgically created arteriovenous fistula, initial encounter: Secondary | ICD-10-CM | POA: Insufficient documentation

## 2022-01-05 DIAGNOSIS — T82898A Other specified complication of vascular prosthetic devices, implants and grafts, initial encounter: Secondary | ICD-10-CM | POA: Diagnosis not present

## 2022-01-05 HISTORY — PX: AV FISTULA PLACEMENT: SHX1204

## 2022-01-05 LAB — GLUCOSE, CAPILLARY
Glucose-Capillary: 93 mg/dL (ref 70–99)
Glucose-Capillary: 84 mg/dL (ref 70–99)

## 2022-01-05 LAB — POCT I-STAT, CHEM 8
BUN: 104 mg/dL — ABNORMAL HIGH (ref 6–20)
Calcium, Ion: 0.95 mmol/L — ABNORMAL LOW (ref 1.15–1.40)
Chloride: 110 mmol/L (ref 98–111)
Creatinine, Ser: 10.2 mg/dL — ABNORMAL HIGH (ref 0.61–1.24)
Glucose, Bld: 81 mg/dL (ref 70–99)
HCT: 33 % — ABNORMAL LOW (ref 39.0–52.0)
Hemoglobin: 11.2 g/dL — ABNORMAL LOW (ref 13.0–17.0)
Potassium: 5 mmol/L (ref 3.5–5.1)
Sodium: 141 mmol/L (ref 135–145)
TCO2: 21 mmol/L — ABNORMAL LOW (ref 22–32)

## 2022-01-05 SURGERY — ARTERIOVENOUS (AV) FISTULA CREATION
Anesthesia: Monitor Anesthesia Care | Laterality: Left

## 2022-01-05 MED ORDER — GLYCOPYRROLATE 0.2 MG/ML IJ SOLN
INTRAMUSCULAR | Status: DC | PRN
  Administered 2022-01-05 (×2): .1 mg via INTRAVENOUS

## 2022-01-05 MED ORDER — ORAL CARE MOUTH RINSE: 15.0000 mL | Freq: Once | OROMUCOSAL | Status: AC

## 2022-01-05 MED ORDER — FENTANYL CITRATE (PF) 100 MCG/2ML IJ SOLN: 25.0000 ug | INTRAMUSCULAR | Status: DC | PRN

## 2022-01-05 MED ORDER — OXYCODONE-ACETAMINOPHEN 5-325 MG PO TABS: 1.0000 | ORAL_TABLET | Freq: Four times a day (QID) | ORAL | 0 refills | Status: DC | PRN

## 2022-01-05 MED ORDER — CHLORHEXIDINE GLUCONATE 4 % EX LIQD: 60.0000 mL | Freq: Once | CUTANEOUS | Status: DC

## 2022-01-05 MED ORDER — CEFAZOLIN SODIUM-DEXTROSE 2-4 GM/100ML-% IV SOLN
INTRAVENOUS | Status: AC
  Filled 2022-01-05: qty 100

## 2022-01-05 MED ORDER — FENTANYL CITRATE (PF) 100 MCG/2ML IJ SOLN
INTRAMUSCULAR | Status: DC | PRN
  Administered 2022-01-05 (×2): 50 ug via INTRAVENOUS

## 2022-01-05 MED ORDER — FENTANYL CITRATE (PF) 250 MCG/5ML IJ SOLN
INTRAMUSCULAR | Status: AC
  Filled 2022-01-05: qty 5

## 2022-01-05 MED ORDER — 0.9 % SODIUM CHLORIDE (POUR BTL) OPTIME
TOPICAL | Status: DC | PRN
  Administered 2022-01-05: 1000 mL

## 2022-01-05 MED ORDER — CEFAZOLIN SODIUM-DEXTROSE 2-4 GM/100ML-% IV SOLN
2.0000 g | INTRAVENOUS | Status: AC
  Administered 2022-01-05: 2 g via INTRAVENOUS

## 2022-01-05 MED ORDER — ACETAMINOPHEN 325 MG PO TABS: 325.0000 mg | ORAL_TABLET | ORAL | Status: DC | PRN

## 2022-01-05 MED ORDER — OXYCODONE HCL 5 MG PO TABS: 5.0000 mg | ORAL_TABLET | Freq: Once | ORAL | Status: DC | PRN

## 2022-01-05 MED ORDER — MEPERIDINE HCL 25 MG/ML IJ SOLN: 6.2500 mg | INTRAMUSCULAR | Status: DC | PRN

## 2022-01-05 MED ORDER — PROPOFOL 500 MG/50ML IV EMUL
INTRAVENOUS | Status: DC | PRN
Start: 2022-01-05 — End: 2022-01-05
  Administered 2022-01-05: 150 ug/kg/min via INTRAVENOUS

## 2022-01-05 MED ORDER — CHLORHEXIDINE GLUCONATE 0.12 % MT SOLN
OROMUCOSAL | Status: AC
  Administered 2022-01-05: 15 mL via OROMUCOSAL
  Filled 2022-01-05: qty 15

## 2022-01-05 MED ORDER — LIDOCAINE-EPINEPHRINE (PF) 1 %-1:200000 IJ SOLN
INTRAMUSCULAR | Status: AC
  Filled 2022-01-05: qty 30

## 2022-01-05 MED ORDER — HEPARIN 6000 UNIT IRRIGATION SOLUTION
Status: AC
  Filled 2022-01-05: qty 500

## 2022-01-05 MED ORDER — ACETAMINOPHEN 160 MG/5ML PO SOLN: 325.0000 mg | ORAL | Status: DC | PRN

## 2022-01-05 MED ORDER — INSULIN ASPART 100 UNIT/ML IJ SOLN: 0.0000 [IU] | INTRAMUSCULAR | Status: DC | PRN

## 2022-01-05 MED ORDER — MIDAZOLAM HCL 2 MG/2ML IJ SOLN
INTRAMUSCULAR | Status: AC
  Filled 2022-01-05: qty 2

## 2022-01-05 MED ORDER — ONDANSETRON HCL 4 MG/2ML IJ SOLN: 4.0000 mg | Freq: Once | INTRAMUSCULAR | Status: DC | PRN

## 2022-01-05 MED ORDER — MIDAZOLAM HCL 5 MG/5ML IJ SOLN
INTRAMUSCULAR | Status: DC | PRN
  Administered 2022-01-05: 2 mg via INTRAVENOUS

## 2022-01-05 MED ORDER — LACTATED RINGERS IV SOLN: INTRAVENOUS | Status: DC

## 2022-01-05 MED ORDER — PROPOFOL 10 MG/ML IV BOLUS
INTRAVENOUS | Status: AC
  Filled 2022-01-05: qty 20

## 2022-01-05 MED ORDER — PROPOFOL 10 MG/ML IV BOLUS
INTRAVENOUS | Status: DC | PRN
  Administered 2022-01-05: 15 mg via INTRAVENOUS

## 2022-01-05 MED ORDER — CHLORHEXIDINE GLUCONATE 0.12 % MT SOLN: 15.0000 mL | Freq: Once | OROMUCOSAL | Status: AC

## 2022-01-05 MED ORDER — SODIUM CHLORIDE 0.9 % IV SOLN: INTRAVENOUS | Status: DC

## 2022-01-05 MED ORDER — OXYCODONE HCL 5 MG/5ML PO SOLN: 5.0000 mg | Freq: Once | ORAL | Status: DC | PRN

## 2022-01-05 SURGICAL SUPPLY — 36 items
ADH SKN CLS APL DERMABOND .7 (GAUZE/BANDAGES/DRESSINGS) ×1
ARMBAND PINK RESTRICT EXTREMIT (MISCELLANEOUS) ×3 IMPLANT
BAG COUNTER SPONGE SURGICOUNT (BAG) ×3 IMPLANT
BAG SPNG CNTER NS LX DISP (BAG) ×1
BNDG ELASTIC 4X5.8 VLCR STR LF (GAUZE/BANDAGES/DRESSINGS) ×1 IMPLANT
BNDG GAUZE ELAST 4 BULKY (GAUZE/BANDAGES/DRESSINGS) ×1 IMPLANT
CANISTER SUCT 3000ML PPV (MISCELLANEOUS) ×3 IMPLANT
CLIP VESOCCLUDE MED 6/CT (CLIP) ×3 IMPLANT
CLIP VESOCCLUDE SM WIDE 6/CT (CLIP) ×3 IMPLANT
COVER PROBE W GEL 5X96 (DRAPES) ×1 IMPLANT
DERMABOND ADVANCED (GAUZE/BANDAGES/DRESSINGS) ×1
DERMABOND ADVANCED .7 DNX12 (GAUZE/BANDAGES/DRESSINGS) ×2 IMPLANT
ELECT REM PT RETURN 9FT ADLT (ELECTROSURGICAL) ×2
ELECTRODE REM PT RTRN 9FT ADLT (ELECTROSURGICAL) ×2 IMPLANT
GLOVE BIO SURGEON STRL SZ 6.5 (GLOVE) ×1 IMPLANT
GLOVE SURG SS PI 7.5 STRL IVOR (GLOVE) ×9 IMPLANT
GOWN STRL REUS W/ TWL LRG LVL3 (GOWN DISPOSABLE) ×4 IMPLANT
GOWN STRL REUS W/ TWL XL LVL3 (GOWN DISPOSABLE) ×2 IMPLANT
GOWN STRL REUS W/TWL LRG LVL3 (GOWN DISPOSABLE) ×4
GOWN STRL REUS W/TWL XL LVL3 (GOWN DISPOSABLE) ×2
KIT BASIN OR (CUSTOM PROCEDURE TRAY) ×3 IMPLANT
KIT TURNOVER KIT B (KITS) ×3 IMPLANT
NS IRRIG 1000ML POUR BTL (IV SOLUTION) ×3 IMPLANT
PACK CV ACCESS (CUSTOM PROCEDURE TRAY) ×3 IMPLANT
PAD ARMBOARD 7.5X6 YLW CONV (MISCELLANEOUS) ×6 IMPLANT
SLING ARM FOAM STRAP LRG (SOFTGOODS) ×1 IMPLANT
SUT MNCRL AB 4-0 PS2 18 (SUTURE) ×2 IMPLANT
SUT PROLENE 6 0 BV (SUTURE) ×1 IMPLANT
SUT SILK 0 TIES 10X30 (SUTURE) ×3 IMPLANT
SUT VIC AB 3-0 SH 27 (SUTURE) ×4
SUT VIC AB 3-0 SH 27X BRD (SUTURE) ×2 IMPLANT
SUT VIC AB 4-0 PS2 18 (SUTURE) ×1 IMPLANT
SUT VICRYL 4-0 PS2 18IN ABS (SUTURE) ×1 IMPLANT
TOWEL GREEN STERILE (TOWEL DISPOSABLE) ×3 IMPLANT
UNDERPAD 30X36 HEAVY ABSORB (UNDERPADS AND DIAPERS) ×3 IMPLANT
WATER STERILE IRR 1000ML POUR (IV SOLUTION) ×3 IMPLANT

## 2022-01-05 NOTE — Anesthesia Postprocedure Evaluation (Signed)
Anesthesia Post Note  Patient: Joshua Mullins  Procedure(s) Performed: ARTERIOVENOUS (AV) FISTULA REVISION AND ELEVATION LEFT ARM (Left)     Patient location during evaluation: PACU Anesthesia Type: Regional and MAC Level of consciousness: awake Pain management: pain level controlled Vital Signs Assessment: post-procedure vital signs reviewed and stable Respiratory status: spontaneous breathing Cardiovascular status: stable Postop Assessment: no apparent nausea or vomiting Anesthetic complications: no   No notable events documented.  Last Vitals:  Vitals:   01/05/22 0945 01/05/22 1000  BP: 134/81 (!) 144/95  Pulse: 99 99  Resp: 11 16  Temp:    SpO2: 95% 95%    Last Pain:  Vitals:   01/05/22 1000  PainSc: 0-No pain                 John F Balthazar Dooly Jr

## 2022-01-05 NOTE — Interval H&P Note (Signed)
History and Physical Interval Note:  01/05/2022 7:27 AM  Joshua Mullins  has presented today for surgery, with the diagnosis of CKD IV.  The various methods of treatment have been discussed with the patient and family. After consideration of risks, benefits and other options for treatment, the patient has consented to  Procedure(s): BRANCH LIGATION OF LEFT ARM FISTULA (Left) as a surgical intervention.  The patient's history has been reviewed, patient examined, no change in status, stable for surgery.  I have reviewed the patient's chart and labs.  Questions were answered to the patient's satisfaction.     [name]

## 2022-01-05 NOTE — Interval H&P Note (Signed)
History and Physical Interval Note:  01/05/2022 7:13 AM  Joshua Mullins  has presented today for surgery, with the diagnosis of CKD IV.  The various methods of treatment have been discussed with the patient and family. After consideration of risks, benefits and other options for treatment, the patient has consented to  Procedure(s): BRANCH LIGATION OF LEFT ARM FISTULA (Left) as a surgical intervention.  The patient's history has been reviewed, patient examined, no change in status, stable for surgery.  I have reviewed the patient's chart and labs.  Questions were answered to the patient's satisfaction.     [name]

## 2022-01-05 NOTE — Anesthesia Procedure Notes (Signed)
Procedure Name: MAC Date/Time: 01/05/2022 7:44 AM Performed by: Lieutenant Diego, CRNA Pre-anesthesia Checklist: Patient identified, Emergency Drugs available, Suction available, Patient being monitored and Timeout performed Patient Re-evaluated:Patient Re-evaluated prior to induction Oxygen Delivery Method: Nasal cannula Preoxygenation: Pre-oxygenation with 100% oxygen Induction Type: IV induction

## 2022-01-05 NOTE — Anesthesia Preprocedure Evaluation (Addendum)
Anesthesia Evaluation  Patient identified by MRN, date of birth, ID band Patient awake    Reviewed: Allergy & Precautions, NPO status , Patient's Chart, lab work & pertinent test results, reviewed documented beta blocker date and time   Airway Mallampati: IV  TM Distance: >3 FB Neck ROM: Full    Dental  (+) Teeth Intact, Dental Advisory Given   Pulmonary sleep apnea and Continuous Positive Airway Pressure Ventilation ,    Pulmonary exam normal        Cardiovascular hypertension, Pt. on home beta blockers Normal cardiovascular exam     Neuro/Psych negative neurological ROS  negative psych ROS   GI/Hepatic negative GI ROS, Neg liver ROS,   Endo/Other  diabetes, Type 2, Oral Hypoglycemic Agents, Insulin DependentObesity   Renal/GU Renal InsufficiencyRenal disease  negative genitourinary   Musculoskeletal  (+) Arthritis ,   Abdominal (+) + obese,   Peds  Hematology negative hematology ROS (+)   Anesthesia Other Findings   Reproductive/Obstetrics                             Anesthesia Physical  Anesthesia Plan  ASA: 3  Anesthesia Plan: Regional and MAC   Post-op Pain Management: Regional block   Induction:   PONV Risk Score and Plan: 1 and Propofol infusion, Treatment may vary due to age or medical condition, Midazolam and TIVA  Airway Management Planned: Nasal Cannula, Natural Airway and Simple Face Mask  Additional Equipment: None  Intra-op Plan:   Post-operative Plan:   Informed Consent: I have reviewed the patients History and Physical, chart, labs and discussed the procedure including the risks, benefits and alternatives for the proposed anesthesia with the patient or authorized representative who has indicated his/her understanding and acceptance.     Dental advisory given  Plan Discussed with: CRNA  Anesthesia Plan Comments:         Anesthesia Quick  Evaluation

## 2022-01-05 NOTE — Transfer of Care (Signed)
Immediate Anesthesia Transfer of Care Note  Patient: Joshua Mullins  Procedure(s) Performed: ARTERIOVENOUS (AV) FISTULA REVISION AND ELEVATION LEFT ARM (Left)  Patient Location: PACU  Anesthesia Type:MAC and Regional  Level of Consciousness: awake  Airway & Oxygen Therapy: Patient Spontanous Breathing and Patient connected to nasal cannula oxygen  Post-op Assessment: Report given to RN and Post -op Vital signs reviewed and stable  Post vital signs: Reviewed and stable  Last Vitals:  Vitals Value Taken Time  BP 147/78 01/05/22 0928  Temp    Pulse 105 01/05/22 0929  Resp 12 01/05/22 0929  SpO2 95 % 01/05/22 0929  Vitals shown include unvalidated device data.  Last Pain:  Vitals:   01/05/22 0600  PainSc: 0-No pain         Complications: No notable events documented.

## 2022-01-05 NOTE — Discharge Instructions (Signed)
   [facility]  Discharge Instructions  AV Fistula or Graft Surgery for Dialysis Access  Please refer to the following instructions for your post-procedure care. Your surgeon or physician assistant will discuss any changes with you.  Activity  You may drive the day following your surgery, if you are comfortable and no longer taking prescription pain medication. Resume full activity as the soreness in your incision resolves.  Bathing/Showering  You may shower after you go home. Keep your incision dry for 48 hours. Do not soak in a bathtub, hot tub, or swim until the incision heals completely. You may not shower if you have a hemodialysis catheter.  Incision Care  Clean your incision with mild soap and water after 48 hours. Pat the area dry with a clean towel. You do not need a bandage unless otherwise instructed. Do not apply any ointments or creams to your incision. You may have skin glue on your incision. Do not peel it off. It will come off on its own in about one week. Your arm may swell a bit after surgery. To reduce swelling use pillows to elevate your arm so it is above your heart. Your doctor will tell you if you need to lightly wrap your arm with an ACE bandage.  Diet  Resume your normal diet. There are not special food restrictions following this procedure. In order to heal from your surgery, it is CRITICAL to get adequate nutrition. Your body requires vitamins, minerals, and protein. Vegetables are the best source of vitamins and minerals. Vegetables also provide the perfect balance of protein. Processed food has little nutritional value, so try to avoid this.  Medications  Resume taking all of your medications. If your incision is causing pain, you may take over-the counter pain relievers such as acetaminophen (Tylenol). If you were prescribed a stronger pain medication, please be aware these medications can cause nausea and constipation. Prevent  nausea by taking the medication with a snack or meal. Avoid constipation by drinking plenty of fluids and eating foods with high amount of fiber, such as fruits, vegetables, and grains.  Do not take Tylenol if you are taking prescription pain medications.  Follow up Your surgeon may want to see you in the office following your access surgery. If so, this will be arranged at the time of your surgery.  Please call us immediately for any of the following conditions:  Increased pain, redness, drainage (pus) from your incision site Fever of 101 degrees or higher Severe or worsening pain at your incision site Hand pain or numbness.  Reduce your risk of vascular disease:  Stop smoking. If you would like help, call [facility] at 1-800-QUIT-NOW 540-717-5587) or [place] at [place] your cholesterol Maintain a desired weight Control your diabetes Keep your blood pressure down  Dialysis  It will take several weeks to several months for your new dialysis access to be ready for use. Your surgeon will determine when it is okay to use it. Your nephrologist will continue to direct your dialysis. You can continue to use your Permcath until your new access is ready for use.   01/05/2022 Joshua Mullins [id number] 1975-12-18  Surgeon(s): Serafina Mitchell, MD  Procedure(s): LEFT ARM ARTERIOVENOUS (AV) FISTULA REVISION AND SUPERFICIALIZATION   X Do not stick fistula for 6 weeks    If you have any questions, please call the office at [phone].

## 2022-01-05 NOTE — Op Note (Signed)
    Patient name: Joshua Mullins MRN: [id number] DOB: 1976-08-17 Sex: male  01/05/2022 Pre-operative Diagnosis: None maturing left radiocephalic fistula Post-operative diagnosis:  Same Surgeon:  [name] Assistants: Aldona Bar Ryne Procedure:   Revision of left radiocephalic fistula via superficialization, branch ligation, and resection with end to end anastomosis Anesthesia: Regional Blood Loss: Minimal Specimens: None  Findings: The patient had a duplicate cephalic vein in the mid forearm.  I mobilized the fistula from the wrist to the elbow.  I selected the more prominent cephalic vein and resected the other 1.  There was a slight narrowing at the branch point and so I resected this and performed a end to end anastomosis.  Indications: This is a 46 year old gentleman with CKD 5.  He underwent a left radiocephalic fistula that has not matured.  Ultrasound suggested multiple branches and depth as the etiology.  He comes in today for revision.  Procedure:  The patient was identified in the holding area and taken to [place] [address]  The patient was then placed supine on the table. regional anesthesia was administered.  The patient was prepped and draped in the usual sterile fashion.  A time out was called and antibiotics were administered.  A PA was necessary to expedite the procedure and assist with technical details.  I evaluated the vein with ultrasound.  There were multiple large branches.  In the mid forearm, the vein bifurcated for about 8 cm and then the 2 branches came back together near the antecubital crease.  I began by making 3 longitudinal incisions in the forearm.  Through these incisions the cephalic vein was fully mobilized.  All side branches were divided between silk ties.  I then evaluated the duplicate system in the mid forearm.  The lateral vein appeared to be the better vein and so I resected the medial vein.  There was some narrowing at the branch point and so I elected to  resect this section of vein which is about 1 cm.  I then performed a end-to-end anastomosis with running 6-0 Prolene.  Prior to completion the appropriate flushing maneuvers were performed, and the anastomosis was completed.  The PA helped with the creation of the anastomosis by following the suture and stabilizing the vein.  At this point, there is no excellent thrill within the fistula.  I used interrupted 3-0 Vicryl to elevate the fistula.  4 Monocryl was used to close the skin directly over top of the fistula.  Dermabond was applied followed by a Kerlix and Ace wrap.  There were no complications.   Disposition: To PACU stable.   Theotis Burrow, M.D., [facility] [place] Office: [phone] Pager:  [phone]

## 2022-01-06 ENCOUNTER — Encounter (HOSPITAL_COMMUNITY): Payer: Self-pay | Admitting: Surgery

## 2022-01-06 MED ORDER — ROPIVACAINE HCL 5 MG/ML IJ SOLN
INTRAMUSCULAR | Status: DC | PRN
  Administered 2022-01-05 (×5): 3 mL via PERINEURAL

## 2022-01-06 NOTE — Anesthesia Procedure Notes (Addendum)
Anesthesia Regional Block: Interscalene brachial plexus block   Pre-Anesthetic Checklist: , timeout performed,  Correct Patient, Correct Site, Correct Laterality,  Correct Procedure, Correct Position, site marked,  Risks and benefits discussed,  Surgical consent,  Pre-op evaluation,  At surgeon's request and post-op pain management  Laterality: Left and Upper  Prep: chloraprep       Needles:  Injection technique: Single-shot  Needle Type: Echogenic Stimulator Needle     Needle Length: 9cm  Needle Gauge: 20   Needle insertion depth: 2 cm   Additional Needles:   Procedures:,,,, ultrasound used (permanent image in chart),,    Narrative:  Start time: 01/06/2022 7:15 AM End time: 01/06/2022 7:22 AM Injection made incrementally with aspirations every 5 mL.  Performed by: Personally  Anesthesiologist: Lyn Hollingshead, MD

## 2022-01-06 NOTE — Addendum Note (Signed)
Addendum  created 01/06/22 1908 by Lyn Hollingshead, MD   Child order released for a procedure order, Clinical Note Signed, Intraprocedure Blocks edited, Intraprocedure Meds edited, SmartForm saved

## 2022-01-17 ENCOUNTER — Ambulatory Visit (INDEPENDENT_AMBULATORY_CARE_PROVIDER_SITE_OTHER): Payer: BC Managed Care – PPO | Admitting: Physician Assistant

## 2022-01-17 VITALS — BP 141/82 | HR 90 | Temp 98.1°F | Resp 20 | Ht 70.0 in | Wt 226.0 lb

## 2022-01-17 DIAGNOSIS — N184 Chronic kidney disease, stage 4 (severe): Secondary | ICD-10-CM

## 2022-01-17 NOTE — Progress Notes (Signed)
  POST OPERATIVE OFFICE NOTE    CC:  F/u for surgery  HPI:  This is a 46 y.o. male who is s/p Revision of left radiocephalic fistula via superficialization, branch ligation, and resection with end to end anastomosis on 01/05/22 by Dr. Trula Slade for CKD stage V.    Pt returns today for follow up.  Pt states he doesn't have symptoms of steal.  No weakness, pain or loss of motor.     Allergies  Allergen Reactions   Amlodipine Swelling   Lisinopril Swelling   Shellfish Allergy Swelling    Current Outpatient Medications  Medication Sig Dispense Refill   allopurinol (ZYLOPRIM) 300 MG tablet Take 300 mg by mouth daily.     atorvastatin (LIPITOR) 10 MG tablet Take 10 mg by mouth daily.     calcium acetate (PHOSLO) 667 MG capsule Take 1,334 mg by mouth 3 (three) times daily with meals.     glipiZIDE (GLUCOTROL XL) 10 MG 24 hr tablet Take 10 mg by mouth daily.     hydrALAZINE (APRESOLINE) 50 MG tablet Take 50 mg by mouth 2 (two) times daily.     latanoprost (XALATAN) 0.005 % ophthalmic solution Place 1 drop into both eyes at bedtime.     metoprolol succinate (TOPROL-XL) 100 MG 24 hr tablet Take 100 mg by mouth daily.     oxyCODONE-acetaminophen (PERCOCET) 5-325 MG tablet Take 1 tablet by mouth every 6 (six) hours as needed. 20 tablet 0   sodium zirconium cyclosilicate (LOKELMA) 10 g PACK packet Take 10 g by mouth See admin instructions. Every 4 days     TRULICITY 1.45 YO/0.45YO SOPN Inject 1.5 mg into the skin every Sunday.     Vitamin D, Ergocalciferol, (DRISDOL) 1.25 MG (50000 UNIT) CAPS capsule Take 50,000 Units by mouth every Monday.     No current facility-administered medications for this visit.     ROS:  See HPI  Physical Exam:    Incision:  well healed Extremities:  palpable thrill, radial pulse and M/sensation are intact left UE     Assessment/Plan:  This is a 46 y.o. male who is s/p: Branch ligation with superficialization of the left forearm RC AV fistula.  The fistula may  be accessed in 2 weeks for HD if needed.   F/U PRN   Roxy Horseman PA-C [facility] [phone]   Clinic MD:  Carlis Abbott

## 2022-04-11 ENCOUNTER — Telehealth: Payer: Self-pay

## 2022-04-11 NOTE — Telephone Encounter (Signed)
Pt called stating that he wanted to get PD access and needed an appt to discuss.  Reviewed pt's chart, returned call for clarification, two identifiers used. Pt stated that he has a L AVF, but has not used it and would rather do PD instead. Placed pt on hold and spoke with Stevens PA. She advised that the pt speak with nephrologist, if he hadn't already, and get referral. Pt had not yet spoke with them. Informed him of process of referral. Confirmed understanding.

## 2022-05-01 ENCOUNTER — Encounter: Payer: Self-pay | Admitting: Surgery

## 2022-05-01 ENCOUNTER — Other Ambulatory Visit: Payer: Self-pay

## 2022-05-01 ENCOUNTER — Ambulatory Visit (INDEPENDENT_AMBULATORY_CARE_PROVIDER_SITE_OTHER): Payer: BC Managed Care – PPO | Admitting: Surgery

## 2022-05-01 VITALS — BP 161/94 | HR 85 | Temp 98.2°F | Resp 20 | Ht 70.0 in | Wt 227.3 lb

## 2022-05-01 DIAGNOSIS — N185 Chronic kidney disease, stage 5: Secondary | ICD-10-CM | POA: Diagnosis not present

## 2022-05-01 NOTE — H&P (View-Only) (Signed)
[facility] [place]  Patient name: Joshua Mullins MRN: [id number] DOB: July 31, 1976 Sex: male   REASON FOR VISIT:    Follow up  HISOTRY OF PRESENT ILLNESS:    Joshua Mullins is a 46 y.o. male who is status post left radiocephalic fistula on 40/04/8118.  He then went on to have revision of the fistula with superficialization and branch ligation on 01/05/2022 day because he is interested in peritoneal dialysis.  He has not had any prior abdominal surgery.  He is set to start dialysis in the near future  Please make managed for hypertension.  He takes a statin for hypercholesterolemia.  He is a diabetic.   PAST MEDICAL HISTORY:   Past Medical History:  Diagnosis Date   Chronic kidney disease    Diabetes mellitus without complication ([place])    Type II   Hyperlipidemia    Hypertension    Sleep apnea    Uses a cpap     FAMILY HISTORY:   History reviewed. No pertinent family history.  SOCIAL HISTORY:   Social History   Tobacco Use   Smoking status: Never   Smokeless tobacco: Never  Substance Use Topics   Alcohol use: No     ALLERGIES:   Allergies  Allergen Reactions   Amlodipine Swelling   Lisinopril Swelling   Shellfish Allergy Swelling     CURRENT MEDICATIONS:   Current Outpatient Medications  Medication Sig Dispense Refill   allopurinol (ZYLOPRIM) 300 MG tablet Take 300 mg by mouth daily.     atorvastatin (LIPITOR) 10 MG tablet Take 10 mg by mouth daily.     calcium acetate (PHOSLO) 667 MG capsule Take 1,334 mg by mouth 3 (three) times daily with meals.     glipiZIDE (GLUCOTROL XL) 10 MG 24 hr tablet Take 10 mg by mouth daily.     hydrALAZINE (APRESOLINE) 50 MG tablet Take 50 mg by mouth 2 (two) times daily.     latanoprost (XALATAN) 0.005 % ophthalmic solution Place 1 drop into both eyes at bedtime.     metoprolol succinate (TOPROL-XL) 100 MG 24 hr tablet Take 100 mg by mouth daily.      oxyCODONE-acetaminophen (PERCOCET) 5-325 MG tablet Take 1 tablet by mouth every 6 (six) hours as needed. 20 tablet 0   sodium zirconium cyclosilicate (LOKELMA) 10 g PACK packet Take 10 g by mouth See admin instructions. Every 4 days     TRULICITY 1.5 JY/7.8GN SOPN Inject 1.5 mg into the skin every Sunday.     Vitamin D, Ergocalciferol, (DRISDOL) 1.25 MG (50000 UNIT) CAPS capsule Take 50,000 Units by mouth every Monday.     No current facility-administered medications for this visit.    REVIEW OF SYSTEMS:   [X]  denotes positive finding, [ ]  denotes negative finding Cardiac  Comments:  Chest pain or chest pressure:    Shortness of breath upon exertion:    Short of breath when lying flat:    Irregular heart rhythm:        Vascular    Pain in calf, thigh, or hip brought on by ambulation:    Pain in feet at night that wakes you up from your sleep:     Blood clot in your veins:    Leg swelling:         Pulmonary    Oxygen at home:    Productive cough:     Wheezing:         Neurologic  Sudden weakness in arms or legs:     Sudden numbness in arms or legs:     Sudden onset of difficulty speaking or slurred speech:    Temporary loss of vision in one eye:     Problems with dizziness:         Gastrointestinal    Blood in stool:     Vomited blood:         Genitourinary    Burning when urinating:     Blood in urine:        Psychiatric    Major depression:         Hematologic    Bleeding problems:    Problems with blood clotting too easily:        Skin    Rashes or ulcers:        Constitutional    Fever or chills:      PHYSICAL EXAM:   Vitals:   05/01/22 0955  BP: (!) 161/94  Pulse: 85  Resp: 20  Temp: 98.2 F (36.8 C)  SpO2: 96%  Weight: 227 lb 4.8 oz (103.1 kg)  Height: 5\' 10"  (1.778 m)    GENERAL: The patient is a well-nourished male, in no acute distress. The vital signs are documented above. CARDIAC: There is a regular rate and rhythm.  VASCULAR:  Excellent thrill within left radiocephalic fistula PULMONARY: Non-labored respirations ABDOMEN: Soft and non-tender  MUSCULOSKELETAL: There are no major deformities or cyanosis. NEUROLOGIC: No focal weakness or paresthesias are detected. SKIN: There are no ulcers or rashes noted. PSYCHIATRIC: The patient has a normal affect.  STUDIES:   None  MEDICAL ISSUES:   The patient is scheduled to start dialysis in the near future and is interested in peritoneal dialysis.  I discussed the details of the procedure with the patient and he is eager to proceed.  We will work on getting this scheduled in the near future.  I discussed the risks and benefits as well as complications.  All questions were answered.    Joshua Alf, MD, FACS [facility] [phone] Pager [phone]

## 2022-05-01 NOTE — Progress Notes (Signed)
[facility] of [place]  Patient name: Joshua Mullins MRN: [id number] DOB: October 20, 1975 Sex: male   REASON FOR VISIT:    Follow up  HISOTRY OF PRESENT ILLNESS:    Joshua Mullins is a 46 y.o. male who is status post left radiocephalic fistula on 35/12/7320.  He then went on to have revision of the fistula with superficialization and branch ligation on 01/05/2022 day because he is interested in peritoneal dialysis.  He has not had any prior abdominal surgery.  He is set to start dialysis in the near future  Please make managed for hypertension.  He takes a statin for hypercholesterolemia.  He is a diabetic.   PAST MEDICAL HISTORY:   Past Medical History:  Diagnosis Date   Chronic kidney disease    Diabetes mellitus without complication ([place])    Type II   Hyperlipidemia    Hypertension    Sleep apnea    Uses a cpap     FAMILY HISTORY:   History reviewed. No pertinent family history.  SOCIAL HISTORY:   Social History   Tobacco Use   Smoking status: Never   Smokeless tobacco: Never  Substance Use Topics   Alcohol use: No     ALLERGIES:   Allergies  Allergen Reactions   Amlodipine Swelling   Lisinopril Swelling   Shellfish Allergy Swelling     CURRENT MEDICATIONS:   Current Outpatient Medications  Medication Sig Dispense Refill   allopurinol (ZYLOPRIM) 300 MG tablet Take 300 mg by mouth daily.     atorvastatin (LIPITOR) 10 MG tablet Take 10 mg by mouth daily.     calcium acetate (PHOSLO) 667 MG capsule Take 1,334 mg by mouth 3 (three) times daily with meals.     glipiZIDE (GLUCOTROL XL) 10 MG 24 hr tablet Take 10 mg by mouth daily.     hydrALAZINE (APRESOLINE) 50 MG tablet Take 50 mg by mouth 2 (two) times daily.     latanoprost (XALATAN) 0.005 % ophthalmic solution Place 1 drop into both eyes at bedtime.     metoprolol succinate (TOPROL-XL) 100 MG 24 hr tablet Take 100 mg by mouth daily.      oxyCODONE-acetaminophen (PERCOCET) 5-325 MG tablet Take 1 tablet by mouth every 6 (six) hours as needed. 20 tablet 0   sodium zirconium cyclosilicate (LOKELMA) 10 g PACK packet Take 10 g by mouth See admin instructions. Every 4 days     TRULICITY 1.5 GU/5.4YH SOPN Inject 1.5 mg into the skin every Sunday.     Vitamin D, Ergocalciferol, (DRISDOL) 1.25 MG (50000 UNIT) CAPS capsule Take 50,000 Units by mouth every Monday.     No current facility-administered medications for this visit.    REVIEW OF SYSTEMS:   [X]  denotes positive finding, [ ]  denotes negative finding Cardiac  Comments:  Chest pain or chest pressure:    Shortness of breath upon exertion:    Short of breath when lying flat:    Irregular heart rhythm:        Vascular    Pain in calf, thigh, or hip brought on by ambulation:    Pain in feet at night that wakes you up from your sleep:     Blood clot in your veins:    Leg swelling:         Pulmonary    Oxygen at home:    Productive cough:     Wheezing:         Neurologic  Sudden weakness in arms or legs:     Sudden numbness in arms or legs:     Sudden onset of difficulty speaking or slurred speech:    Temporary loss of vision in one eye:     Problems with dizziness:         Gastrointestinal    Blood in stool:     Vomited blood:         Genitourinary    Burning when urinating:     Blood in urine:        Psychiatric    Major depression:         Hematologic    Bleeding problems:    Problems with blood clotting too easily:        Skin    Rashes or ulcers:        Constitutional    Fever or chills:      PHYSICAL EXAM:   Vitals:   05/01/22 0955  BP: (!) 161/94  Pulse: 85  Resp: 20  Temp: 98.2 F (36.8 C)  SpO2: 96%  Weight: 227 lb 4.8 oz (103.1 kg)  Height: 5\' 10"  (1.778 m)    GENERAL: The patient is a well-nourished male, in no acute distress. The vital signs are documented above. CARDIAC: There is a regular rate and rhythm.  VASCULAR:  Excellent thrill within left radiocephalic fistula PULMONARY: Non-labored respirations ABDOMEN: Soft and non-tender  MUSCULOSKELETAL: There are no major deformities or cyanosis. NEUROLOGIC: No focal weakness or paresthesias are detected. SKIN: There are no ulcers or rashes noted. PSYCHIATRIC: The patient has a normal affect.  STUDIES:   None  MEDICAL ISSUES:   The patient is scheduled to start dialysis in the near future and is interested in peritoneal dialysis.  I discussed the details of the procedure with the patient and he is eager to proceed.  We will work on getting this scheduled in the near future.  I discussed the risks and benefits as well as complications.  All questions were answered.    Leia Alf, MD, FACS [facility] [phone] Pager [phone]

## 2022-05-03 ENCOUNTER — Telehealth: Payer: Self-pay

## 2022-05-03 NOTE — Telephone Encounter (Signed)
Patient called office requesting to move surgery for PD cath insertion to 05/17/22 because he has a cold and wants to be completely well before going under anesthesia. Case rescheduled- instructions reviewed. Patient will stop Trulicity on 0/98/28.

## 2022-05-16 ENCOUNTER — Other Ambulatory Visit: Payer: Self-pay

## 2022-05-16 ENCOUNTER — Encounter (HOSPITAL_COMMUNITY): Payer: Self-pay | Admitting: Surgery

## 2022-05-16 NOTE — Progress Notes (Signed)
PCP - Dr. Coletta Memos- [facility]  Cardiologist - Denies  EP- Denies  Endocrine- Denies  Pulm- Denies  Chest x-ray - Denies  EKG - 12/20/21- (CE)- Req'd  Stress Test - 02/11/21 (CE)  ECHO - 02/11/21 (CE)  Cardiac Cath - Denies  AICD-na PM-na LOOP-na  Nerve Stimulator- Denies  Dialysis- Not started yet  Sleep Study - Yes- Positive CPAP - Yes  LABS- 05/17/22: I-Stat 8  ASA- Denies  ERAS- No  HA1C- 03/02/22(CE): 4.9 Fasting Blood Sugar - 71-90 Checks Blood Sugar __1___ every 2 days TRULICIITY- LD- 4/58  Anesthesia- No  Pt denies having chest pain, sob, or fever during the pre-op phone call. All instructions explained to the pt, with a verbal understanding of the material. Pt also instructed to wear a mask and social distance is he goes out. The opportunity to ask questions was provided.

## 2022-05-16 NOTE — Progress Notes (Signed)
S.D.W- Instructions   Your procedure is scheduled on Wed., Sept. 27, 2023 from 11:49AM-2:04PM.  Report to [facility] Main Entrance "A" at 9:20 A.M., then check in with the Admitting office.  Call this number if you have problems the morning of surgery:  [phone]   Remember:  Do not eat or drink after midnight on Sept. 26th    Take these medicines the morning of surgery with A SIP OF WATER: Allopurinol (ZYLOPRIM) Atorvastatin (LIPITOR) HydrALAZINE (APRESOLINE) Metoprolol succinate (TOPROL-XL)  As of today, STOP taking any Aspirin (unless otherwise instructed by your surgeon) Aleve, Naproxen, Ibuprofen, Motrin, Advil, Goody's, BC's, all herbal medications, fish oil, and all vitamins.  How to Manage Your Diabetes Before and After Surgery  How do I manage my blood sugar before surgery? Check your blood sugar the morning of your surgery when you wake up and every 2 hours until you get to the Short Stay unit. If your blood sugar is less than 70 mg/dL, you will need to treat for low blood sugar: Do not take insulin. Treat a low blood sugar (less than 70 mg/dL) with  cup of clear juice (cranberry or apple), 4 glucose tablets, OR glucose gel. Recheck blood sugar in 15 minutes after treatment (to make sure it is greater than 70 mg/dL). If your blood sugar is not greater than 70 mg/dL on recheck, call [phone]  for further instructions. Report your blood sugar to the short stay nurse when you get to Short Stay.  WHAT DO I DO ABOUT MY DIABETES MEDICATION?  Do not take GlipiZIDE (GLUCOTROL XL) the morning of surgery.  The day of surgery, do not take other diabetes injectable Trulicity (dulaglutide).  If your CBG is greater than 220 mg/dL, inform the staff upon arrival to Pre-Op.  Reviewed and Endorsed by [facility] Patient Education Committee, August 2015          Do not wear jewelry. Do not wear lotions, powders, cologne or deodorant. Do not shave 48 hours prior to surgery.   Men may shave face and neck. Do not bring valuables to the hospital.  [facility] is not responsible for any belongings or valuables.    Do NOT Smoke (Tobacco/Vaping)  24 hours prior to your procedure  If you use a CPAP at night, you may bring your mask for your overnight stay.   Contacts, glasses, hearing aids, dentures or partials may not be worn into surgery, please bring cases for these belongings   For patients admitted to the hospital, discharge time will be determined by your treatment team.   Patients discharged the day of surgery will not be allowed to drive home, and someone needs to stay with them for 24 hours.  Special instructions:    Oral Hygiene is also important to reduce your risk of infection.  Remember - BRUSH YOUR TEETH THE MORNING OF SURGERY WITH YOUR REGULAR TOOTHPASTE  [place]- Preparing For Surgery  Before surgery, you can play an important role. Because skin is not sterile, your skin needs to be as free of germs as possible. You can reduce the number of germs on your skin by washing with Antibacterial Soap before surgery.     Please follow these instructions carefully.     Shower the NIGHT BEFORE SURGERY and the MORNING OF SURGERY with Antibacterial Soap.   Pat yourself dry with a CLEAN TOWEL.  Wear CLEAN PAJAMAS to bed the night before surgery  Place CLEAN SHEETS on your bed the night before your  surgery  DO NOT SLEEP WITH PETS.  Day of Surgery:  Take a shower with Antibacterial soap. Wear Clean/Comfortable clothing the morning of surgery Do not apply any deodorants/lotions.   Remember to brush your teeth WITH YOUR REGULAR TOOTHPASTE.   If you test positive for Covid, or been in contact with anyone that has tested positive in the last 10 days, please notify your surgeon.  SURGICAL WAITING ROOM VISITATION Patients having surgery or a procedure may have no more than 2 support people in the waiting area - these visitors may rotate.   Children  under the age of 29 must have an adult with them who is not the patient. If the patient needs to stay at the hospital during part of their recovery, the visitor guidelines for inpatient rooms apply. Pre-op nurse will coordinate an appropriate time for 1 support person to accompany patient in pre-op.  This support person may not rotate.   Please refer to the [facility] website for the visitor guidelines for Inpatients (after your surgery is over and you are in a regular room).

## 2022-05-17 ENCOUNTER — Other Ambulatory Visit: Payer: Self-pay

## 2022-05-17 ENCOUNTER — Ambulatory Visit (HOSPITAL_COMMUNITY): Payer: BC Managed Care – PPO | Admitting: Certified Registered Nurse Anesthetist

## 2022-05-17 ENCOUNTER — Ambulatory Visit (HOSPITAL_COMMUNITY)
Admission: RE | Admit: 2022-05-17 | Discharge: 2022-05-17 | Disposition: A | Discharge status: Home / Self Care | Payer: BC Managed Care – PPO | Attending: Surgery | Admitting: Surgery

## 2022-05-17 ENCOUNTER — Encounter (HOSPITAL_COMMUNITY): Payer: Self-pay | Admitting: Surgery

## 2022-05-17 ENCOUNTER — Encounter (HOSPITAL_COMMUNITY)
Admission: RE | Disposition: A | Discharge status: Home / Self Care | Payer: Self-pay | Source: Home / Self Care | Attending: Surgery

## 2022-05-17 DIAGNOSIS — Z7985 Long-term (current) use of injectable non-insulin antidiabetic drugs: Secondary | ICD-10-CM | POA: Insufficient documentation

## 2022-05-17 DIAGNOSIS — Z79899 Other long term (current) drug therapy: Secondary | ICD-10-CM | POA: Diagnosis not present

## 2022-05-17 DIAGNOSIS — N185 Chronic kidney disease, stage 5: Secondary | ICD-10-CM

## 2022-05-17 DIAGNOSIS — N186 End stage renal disease: Secondary | ICD-10-CM | POA: Insufficient documentation

## 2022-05-17 DIAGNOSIS — Z7984 Long term (current) use of oral hypoglycemic drugs: Secondary | ICD-10-CM | POA: Diagnosis not present

## 2022-05-17 DIAGNOSIS — I10 Essential (primary) hypertension: Secondary | ICD-10-CM | POA: Diagnosis not present

## 2022-05-17 DIAGNOSIS — E1122 Type 2 diabetes mellitus with diabetic chronic kidney disease: Secondary | ICD-10-CM | POA: Diagnosis present

## 2022-05-17 DIAGNOSIS — I120 Hypertensive chronic kidney disease with stage 5 chronic kidney disease or end stage renal disease: Secondary | ICD-10-CM | POA: Diagnosis not present

## 2022-05-17 DIAGNOSIS — G4730 Sleep apnea, unspecified: Secondary | ICD-10-CM | POA: Diagnosis not present

## 2022-05-17 HISTORY — PX: CAPD INSERTION: SHX5233

## 2022-05-17 LAB — GLUCOSE, CAPILLARY
Glucose-Capillary: 123 mg/dL — ABNORMAL HIGH (ref 70–99)
Glucose-Capillary: 69 mg/dL — ABNORMAL LOW (ref 70–99)
Glucose-Capillary: 69 mg/dL — ABNORMAL LOW (ref 70–99)
Glucose-Capillary: 110 mg/dL — ABNORMAL HIGH (ref 70–99)

## 2022-05-17 LAB — POCT I-STAT, CHEM 8
BUN: 99 mg/dL — ABNORMAL HIGH (ref 6–20)
Calcium, Ion: 0.98 mmol/L — ABNORMAL LOW (ref 1.15–1.40)
Chloride: 108 mmol/L (ref 98–111)
Creatinine, Ser: 10 mg/dL — ABNORMAL HIGH (ref 0.61–1.24)
Glucose, Bld: 63 mg/dL — ABNORMAL LOW (ref 70–99)
HCT: 32 % — ABNORMAL LOW (ref 39.0–52.0)
Hemoglobin: 10.9 g/dL — ABNORMAL LOW (ref 13.0–17.0)
Potassium: 4.9 mmol/L (ref 3.5–5.1)
Sodium: 140 mmol/L (ref 135–145)
TCO2: 20 mmol/L — ABNORMAL LOW (ref 22–32)

## 2022-05-17 SURGERY — LAPAROSCOPIC INSERTION CONTINUOUS AMBULATORY PERITONEAL DIALYSIS  (CAPD) CATHETER
Anesthesia: General

## 2022-05-17 MED ORDER — 0.9 % SODIUM CHLORIDE (POUR BTL) OPTIME
TOPICAL | Status: DC | PRN
  Administered 2022-05-17: 500 mL

## 2022-05-17 MED ORDER — LIDOCAINE 2% (20 MG/ML) 5 ML SYRINGE
INTRAMUSCULAR | Status: DC | PRN
  Administered 2022-05-17: 100 mg via INTRAVENOUS

## 2022-05-17 MED ORDER — ROCURONIUM BROMIDE 10 MG/ML (PF) SYRINGE
PREFILLED_SYRINGE | INTRAVENOUS | Status: AC
  Filled 2022-05-17: qty 10

## 2022-05-17 MED ORDER — ACETAMINOPHEN 500 MG PO TABS
1000.0000 mg | ORAL_TABLET | Freq: Once | ORAL | Status: AC
  Administered 2022-05-17: 1000 mg via ORAL
  Filled 2022-05-17: qty 2

## 2022-05-17 MED ORDER — OXYCODONE HCL 5 MG PO TABS
5.0000 mg | ORAL_TABLET | Freq: Once | ORAL | Status: AC | PRN
  Administered 2022-05-17: 5 mg via ORAL

## 2022-05-17 MED ORDER — CHLORHEXIDINE GLUCONATE 0.12 % MT SOLN
OROMUCOSAL | Status: AC
  Administered 2022-05-17: 15 mL
  Filled 2022-05-17: qty 15

## 2022-05-17 MED ORDER — FENTANYL CITRATE (PF) 100 MCG/2ML IJ SOLN
25.0000 ug | INTRAMUSCULAR | Status: DC | PRN
  Administered 2022-05-17: 50 ug via INTRAVENOUS

## 2022-05-17 MED ORDER — CHLORHEXIDINE GLUCONATE 4 % EX LIQD: 60.0000 mL | Freq: Once | CUTANEOUS | Status: DC

## 2022-05-17 MED ORDER — AMISULPRIDE (ANTIEMETIC) 5 MG/2ML IV SOLN: 10.0000 mg | Freq: Once | INTRAVENOUS | Status: DC | PRN

## 2022-05-17 MED ORDER — DEXTROSE 50 % IV SOLN
12.5000 g | INTRAVENOUS | Status: AC
  Administered 2022-05-17: 12.5 g via INTRAVENOUS

## 2022-05-17 MED ORDER — FENTANYL CITRATE (PF) 250 MCG/5ML IJ SOLN
INTRAMUSCULAR | Status: AC
  Filled 2022-05-17: qty 5

## 2022-05-17 MED ORDER — FENTANYL CITRATE (PF) 100 MCG/2ML IJ SOLN
INTRAMUSCULAR | Status: AC
  Filled 2022-05-17: qty 2

## 2022-05-17 MED ORDER — PROPOFOL 10 MG/ML IV BOLUS
INTRAVENOUS | Status: AC
  Filled 2022-05-17: qty 20

## 2022-05-17 MED ORDER — SODIUM CHLORIDE 0.9 % IV SOLN: INTRAVENOUS | Status: DC

## 2022-05-17 MED ORDER — DEXTROSE 50 % IV SOLN: 12.5000 g | INTRAVENOUS | Status: AC

## 2022-05-17 MED ORDER — LIDOCAINE-EPINEPHRINE (PF) 1 %-1:200000 IJ SOLN
INTRAMUSCULAR | Status: DC | PRN
  Administered 2022-05-17: 7 mL

## 2022-05-17 MED ORDER — PHENYLEPHRINE 80 MCG/ML (10ML) SYRINGE FOR IV PUSH (FOR BLOOD PRESSURE SUPPORT)
PREFILLED_SYRINGE | INTRAVENOUS | Status: AC
  Filled 2022-05-17: qty 10

## 2022-05-17 MED ORDER — PHENYLEPHRINE HCL-NACL 20-0.9 MG/250ML-% IV SOLN
INTRAVENOUS | Status: DC | PRN
  Administered 2022-05-17: 45 ug/min via INTRAVENOUS

## 2022-05-17 MED ORDER — DEXTROSE 50 % IV SOLN
INTRAVENOUS | Status: AC
  Administered 2022-05-17: 12.5 g via INTRAVENOUS
  Filled 2022-05-17: qty 50

## 2022-05-17 MED ORDER — FENTANYL CITRATE (PF) 250 MCG/5ML IJ SOLN
INTRAMUSCULAR | Status: DC | PRN
  Administered 2022-05-17: 100 ug via INTRAVENOUS

## 2022-05-17 MED ORDER — SUGAMMADEX SODIUM 200 MG/2ML IV SOLN
INTRAVENOUS | Status: DC | PRN
  Administered 2022-05-17: 200 mg via INTRAVENOUS

## 2022-05-17 MED ORDER — DEXAMETHASONE SODIUM PHOSPHATE 10 MG/ML IJ SOLN
INTRAMUSCULAR | Status: DC | PRN
  Administered 2022-05-17: 10 mg via INTRAVENOUS

## 2022-05-17 MED ORDER — LIDOCAINE 2% (20 MG/ML) 5 ML SYRINGE
INTRAMUSCULAR | Status: AC
  Filled 2022-05-17: qty 5

## 2022-05-17 MED ORDER — OXYCODONE HCL 5 MG PO TABS
ORAL_TABLET | ORAL | Status: AC
  Filled 2022-05-17: qty 1

## 2022-05-17 MED ORDER — MIDAZOLAM HCL 2 MG/2ML IJ SOLN
INTRAMUSCULAR | Status: AC
  Filled 2022-05-17: qty 2

## 2022-05-17 MED ORDER — PROPOFOL 10 MG/ML IV BOLUS
INTRAVENOUS | Status: DC | PRN
  Administered 2022-05-17: 200 mg via INTRAVENOUS

## 2022-05-17 MED ORDER — ONDANSETRON HCL 4 MG/2ML IJ SOLN
INTRAMUSCULAR | Status: DC | PRN
  Administered 2022-05-17: 4 mg via INTRAVENOUS

## 2022-05-17 MED ORDER — OXYCODONE-ACETAMINOPHEN 5-325 MG PO TABS: 1.0000 | ORAL_TABLET | Freq: Four times a day (QID) | ORAL | 0 refills | Status: DC | PRN

## 2022-05-17 MED ORDER — ONDANSETRON HCL 4 MG/2ML IJ SOLN
INTRAMUSCULAR | Status: AC
  Filled 2022-05-17: qty 2

## 2022-05-17 MED ORDER — PHENYLEPHRINE 80 MCG/ML (10ML) SYRINGE FOR IV PUSH (FOR BLOOD PRESSURE SUPPORT)
PREFILLED_SYRINGE | INTRAVENOUS | Status: DC | PRN
  Administered 2022-05-17: 160 ug via INTRAVENOUS
  Administered 2022-05-17: 80 ug via INTRAVENOUS

## 2022-05-17 MED ORDER — ROCURONIUM BROMIDE 10 MG/ML (PF) SYRINGE
PREFILLED_SYRINGE | INTRAVENOUS | Status: DC | PRN
  Administered 2022-05-17: 60 mg via INTRAVENOUS

## 2022-05-17 MED ORDER — CEFAZOLIN SODIUM-DEXTROSE 2-4 GM/100ML-% IV SOLN
2.0000 g | INTRAVENOUS | Status: AC
  Administered 2022-05-17: 2 g via INTRAVENOUS
  Filled 2022-05-17: qty 100

## 2022-05-17 MED ORDER — DEXAMETHASONE SODIUM PHOSPHATE 10 MG/ML IJ SOLN
INTRAMUSCULAR | Status: AC
  Filled 2022-05-17: qty 1

## 2022-05-17 MED ORDER — OXYCODONE HCL 5 MG/5ML PO SOLN: 5.0000 mg | Freq: Once | ORAL | Status: AC | PRN

## 2022-05-17 MED ORDER — MIDAZOLAM HCL 2 MG/2ML IJ SOLN
INTRAMUSCULAR | Status: DC | PRN
  Administered 2022-05-17: 2 mg via INTRAVENOUS

## 2022-05-17 MED ORDER — LIDOCAINE-EPINEPHRINE (PF) 1 %-1:200000 IJ SOLN
INTRAMUSCULAR | Status: AC
  Filled 2022-05-17: qty 30

## 2022-05-17 SURGICAL SUPPLY — 52 items
ADAPTER TITANIUM MEDIONICS (MISCELLANEOUS) ×2 IMPLANT
ADH SKN CLS APL DERMABOND .7 (GAUZE/BANDAGES/DRESSINGS) ×1
ADPR DLYS CATH STRL LF DISP (MISCELLANEOUS) ×1
APL PRP STRL LF DISP 70% ISPRP (MISCELLANEOUS) ×1
BAG DECANTER FOR FLEXI CONT (MISCELLANEOUS) ×2 IMPLANT
BIOPATCH RED 1 DISK 7.0 (GAUZE/BANDAGES/DRESSINGS) ×2 IMPLANT
BLADE CLIPPER SURG (BLADE) IMPLANT
BLADE SURG 11 STRL SS (BLADE) ×2 IMPLANT
CATH EXTENDED DIALYSIS (CATHETERS) ×2 IMPLANT
CHLORAPREP W/TINT 26 (MISCELLANEOUS) ×2 IMPLANT
COVER SURGICAL LIGHT HANDLE (MISCELLANEOUS) ×2 IMPLANT
DERMABOND ADVANCED .7 DNX12 (GAUZE/BANDAGES/DRESSINGS) ×2 IMPLANT
DEVICE TROCAR PUNCTURE CLOSURE (ENDOMECHANICALS) ×2 IMPLANT
DRSG COVADERM 4X10 (GAUZE/BANDAGES/DRESSINGS) IMPLANT
DRSG TEGADERM 4X4.75 (GAUZE/BANDAGES/DRESSINGS) ×6 IMPLANT
ELECT REM PT RETURN 9FT ADLT (ELECTROSURGICAL) ×1
ELECTRODE REM PT RTRN 9FT ADLT (ELECTROSURGICAL) ×2 IMPLANT
GAUZE SPONGE 4X4 12PLY STRL (GAUZE/BANDAGES/DRESSINGS) ×2 IMPLANT
GLOVE BIOGEL PI IND STRL 6.5 (GLOVE) IMPLANT
GLOVE INDICATOR 6.5 STRL GRN (GLOVE) ×2 IMPLANT
GLOVE SURG SS PI 7.5 STRL IVOR (GLOVE) ×4 IMPLANT
GOWN STRL REUS W/ TWL LRG LVL3 (GOWN DISPOSABLE) ×4 IMPLANT
GOWN STRL REUS W/ TWL XL LVL3 (GOWN DISPOSABLE) ×2 IMPLANT
GOWN STRL REUS W/TWL LRG LVL3 (GOWN DISPOSABLE) ×2
GOWN STRL REUS W/TWL XL LVL3 (GOWN DISPOSABLE) ×1
IV NS 1000ML (IV SOLUTION) ×1
IV NS 1000ML BAXH (IV SOLUTION) ×2 IMPLANT
KIT BASIN OR (CUSTOM PROCEDURE TRAY) ×2 IMPLANT
KIT TURNOVER KIT B (KITS) ×2 IMPLANT
NDL INSUFFLATION 14GA 120MM (NEEDLE) IMPLANT
NEEDLE INSUFFLATION 14GA 120MM (NEEDLE) IMPLANT
NS IRRIG 1000ML POUR BTL (IV SOLUTION) ×2 IMPLANT
PAD ARMBOARD 7.5X6 YLW CONV (MISCELLANEOUS) ×4 IMPLANT
PENCIL BUTTON HOLSTER BLD 10FT (ELECTRODE) IMPLANT
SET CYSTO W/LG BORE CLAMP LF (SET/KITS/TRAYS/PACK) ×2 IMPLANT
SET EXT 12IN DIALYSIS STAY-SAF (MISCELLANEOUS) ×2 IMPLANT
SET IRRIG TUBING LAPAROSCOPIC (IRRIGATION / IRRIGATOR) IMPLANT
SET TUBE SMOKE EVAC HIGH FLOW (TUBING) ×2 IMPLANT
SLEEVE ENDOPATH XCEL 5M (ENDOMECHANICALS) ×4 IMPLANT
SPIKE FLUID TRANSFER (MISCELLANEOUS) ×2 IMPLANT
STYLET FALLER (MISCELLANEOUS) ×2 IMPLANT
STYLET FALLER MEDIONICS (MISCELLANEOUS) ×2 IMPLANT
SUT MNCRL AB 4-0 PS2 18 (SUTURE) ×4 IMPLANT
SUT PROLENE 0 SH 30 (SUTURE) ×4 IMPLANT
SUT VICRYL 3 0 (SUTURE) IMPLANT
TOWEL GREEN STERILE (TOWEL DISPOSABLE) ×2 IMPLANT
TOWEL GREEN STERILE FF (TOWEL DISPOSABLE) ×2 IMPLANT
TRAY LAPAROSCOPIC MC (CUSTOM PROCEDURE TRAY) ×2 IMPLANT
TROCAR 5MMX150MM (TROCAR) IMPLANT
TROCAR XCEL BLADELESS 5X75MML (TROCAR) ×2 IMPLANT
TROCAR XCEL NON-BLD 5MMX100MML (ENDOMECHANICALS) ×2 IMPLANT
WATER STERILE IRR 1000ML POUR (IV SOLUTION) ×2 IMPLANT

## 2022-05-17 NOTE — Interval H&P Note (Signed)
History and Physical Interval Note:  05/17/2022 10:09 AM  Joshua Mullins  has presented today for surgery, with the diagnosis of CKD V.  The various methods of treatment have been discussed with the patient and family. After consideration of risks, benefits and other options for treatment, the patient has consented to  Procedure(s): [place]  (CAPD) CATHETER (N/A) POSSIBLE LAPAROSCOPIC OMENTOPEXY/ (N/A) as a surgical intervention.  The patient's history has been reviewed, patient examined, no change in status, stable for surgery.  I have reviewed the patient's chart and labs.  Questions were answered to the patient's satisfaction.     [name]

## 2022-05-17 NOTE — Anesthesia Preprocedure Evaluation (Addendum)
Anesthesia Evaluation  Patient identified by MRN, date of birth, ID band Patient awake    Reviewed: Allergy & Precautions, NPO status , Patient's Chart, lab work & pertinent test results, reviewed documented beta blocker date and time   History of Anesthesia Complications Negative for: history of anesthetic complications  Airway Mallampati: IV  TM Distance: >3 FB Neck ROM: Full    Dental no notable dental hx.    Pulmonary sleep apnea and Continuous Positive Airway Pressure Ventilation ,    Pulmonary exam normal        Cardiovascular hypertension, Pt. on medications and Pt. on home beta blockers Normal cardiovascular exam     Neuro/Psych negative neurological ROS  negative psych ROS   GI/Hepatic negative GI ROS, Neg liver ROS,   Endo/Other  diabetes (on Trulicity, last dose 10 days ago), Type 2, Oral Hypoglycemic Agents  Renal/GU ESRFRenal disease  negative genitourinary   Musculoskeletal  (+) Arthritis ,   Abdominal   Peds  Hematology negative hematology ROS (+)   Anesthesia Other Findings Day of surgery medications reviewed with patient.  Reproductive/Obstetrics negative OB ROS                            Anesthesia Physical Anesthesia Plan  ASA: 4  Anesthesia Plan: General   Post-op Pain Management: Tylenol PO (pre-op)*   Induction: Intravenous  PONV Risk Score and Plan: 3 and Treatment may vary due to age or medical condition, Midazolam, Ondansetron and Dexamethasone  Airway Management Planned: Oral ETT  Additional Equipment: None  Intra-op Plan:   Post-operative Plan: Extubation in OR  Informed Consent: I have reviewed the patients History and Physical, chart, labs and discussed the procedure including the risks, benefits and alternatives for the proposed anesthesia with the patient or authorized representative who has indicated his/her understanding and acceptance.      Dental advisory given  Plan Discussed with: CRNA  Anesthesia Plan Comments:        Anesthesia Quick Evaluation

## 2022-05-17 NOTE — Op Note (Signed)
Patient name: Joshua Mullins MRN: [id number] DOB: [birth year] Sex: male  05/17/2022 Pre-operative Diagnosis: End-stage renal disease Post-operative diagnosis:  Same Surgeon:  [name] Assistants:  Leontine Locket, PA Procedure:   Laparoscopic insertion of peritoneal dialysis catheter Anesthesia:  General Blood Loss:  minimal Specimens:  none  Findings:  no intra-abdominal pathology identified  Indications: This is a 46 year old gentleman who comes in today for peritoneal dialysis catheter.  Procedure:  The patient was identified in the holding area and taken to [place] [address]  The patient was then placed supine on the table. general anesthesia was administered.  The patient was prepped and draped in the usual sterile fashion.  A time out was called and antibiotics were administered.  A PA was necessary to expedite the procedure and assist with technical details.  She helped with camera utilization for the procedure.  1% lidocaine was used for all skin nicks.  A #11 blade was used to make a incision and the mid axillary line below the right costal margin.  Using an Optiview 5 mm port the peritoneal cavity was entered under direct vision.  The abdomen was then insufflated to 15 mm of pressure with CO2.  Under direct vision, I placed an additional 5 mm port in the right lower quadrant.  The top of the peritoneal catheter was positioned at the pubic symphysis and an incision was made on the left lateral side of the umbilicus where the cuff was situated.  A 5 mm port was then beveled down to the fascia and then entered into the abdomen under direct vision.  The peritoneal catheter was then inserted through this port.  The cuff was positioned just outside the fascia and the port was then removed.  The catheter was then cut and connected to the metal connector.  This was secured with a Prolene tie.  The other end of the catheter with the 2 cuffs was cut the appropriate length and connected to the  metal connector and secured with a Prolene.  Using the tunneler, this was tunneled up towards the xiphoid and brought out through a separate skin incision.  I then looked into the abdomen and the catheter was tunneled intraperitoneal.  I then remove the catheter.  There was some bleeding from where it entered the peritoneum and so a separate incision was made to cauterize this area.  I also used the hook cautery from the laparoscopy set to cauterize this area.  There was no further bleeding.  The catheter was then read tunneled on top of the fascia and brought out through the subxiphoid incision.  The catheter was then tunneled and brought out in the mid axillary line on the left.  The abdomen was then reinsufflated.  The catheter was inspected and in good position.  I did not feel that omentopexy was necessary.  The abdomen was then desufflated.  The metal connector was positioned on the external portion of the catheter which was connected to a saline bag.  500 cc of saline easily drained into the abdomen.  I then withdrew approximately 200 cc of saline.  I then reinspected the abdomen and felt that everything was in good position.  The CO2 was removed from the abdomen and the ports were removed.  All skin incisions were closed with 4-0 Monocryl followed by Dermabond.  Sterile dressings were applied.  There were no immediate complications.  The patient was successfully extubated   Disposition: To PACU stable   V.  [name], M.D., [facility] [place] Office: [phone] Pager:  [phone]

## 2022-05-17 NOTE — Anesthesia Procedure Notes (Signed)
Procedure Name: Intubation Date/Time: 05/17/2022 10:45 AM  Performed by: Reece Agar, CRNAPre-anesthesia Checklist: Patient identified, Emergency Drugs available, Suction available and Patient being monitored Patient Re-evaluated:Patient Re-evaluated prior to induction Oxygen Delivery Method: Circle System Utilized Preoxygenation: Pre-oxygenation with 100% oxygen Induction Type: IV induction Ventilation: Two handed mask ventilation required and Oral airway inserted - appropriate to patient size Laryngoscope Size: Glidescope and 4 Grade View: Grade I Tube type: Oral Tube size: 7.5 mm Number of attempts: 1 Airway Equipment and Method: Video-laryngoscopy, Oral airway and Stylet Placement Confirmation: ETT inserted through vocal cords under direct vision, positive ETCO2 and breath sounds checked- equal and bilateral Secured at: 23 cm Tube secured with: Tape Dental Injury: Teeth and Oropharynx as per pre-operative assessment  Difficulty Due To: Difficulty was anticipated, Difficult Airway- due to reduced neck mobility and Difficult Airway- due to limited oral opening Comments: Initial DL with MAC 4 with grade 4 view. Switched to Glidescope 4 blade with Grade 1 view.

## 2022-05-17 NOTE — Anesthesia Postprocedure Evaluation (Signed)
Anesthesia Post Note  Patient: Joshua Mullins  Procedure(s) Performed: LAPAROSCOPIC INSERTION CONTINUOUS AMBULATORY PERITONEAL DIALYSIS  (CAPD) CATHETER     Patient location during evaluation: PACU Anesthesia Type: General Level of consciousness: awake and alert Pain management: pain level controlled Vital Signs Assessment: post-procedure vital signs reviewed and stable Respiratory status: spontaneous breathing, nonlabored ventilation and respiratory function stable Cardiovascular status: blood pressure returned to baseline Postop Assessment: no apparent nausea or vomiting Anesthetic complications: yes   Encounter Notable Events  Notable Event Outcome Phase Comment  Difficult to intubate - expected  Intraprocedure Filed from anesthesia note documentation.    Last Vitals:  Vitals:   05/17/22 1300 05/17/22 1315  BP: 121/74 120/86  Pulse: 78 81  Resp: 10 (!) 9  Temp:    SpO2: 94% 95%    Last Pain:  Vitals:   05/17/22 1315  TempSrc:   PainSc: Asleep                 Marthenia Rolling

## 2022-05-17 NOTE — Transfer of Care (Signed)
Immediate Anesthesia Transfer of Care Note  Patient: Joshua Mullins  Procedure(s) Performed: LAPAROSCOPIC INSERTION CONTINUOUS AMBULATORY PERITONEAL DIALYSIS  (CAPD) CATHETER  Patient Location: PACU  Anesthesia Type:General  Level of Consciousness: drowsy  Airway & Oxygen Therapy: Patient Spontanous Breathing and Patient connected to face mask oxygen  Post-op Assessment: Report given to RN and Post -op Vital signs reviewed and stable  Post vital signs: Reviewed and stable  Last Vitals:  Vitals Value Taken Time  BP 133/92 05/17/22 1208  Temp    Pulse 84 05/17/22 1212  Resp 10 05/17/22 1212  SpO2 95 % 05/17/22 1212  Vitals shown include unvalidated device data.  Last Pain:  Vitals:   05/17/22 0751  TempSrc:   PainSc: 0-No pain      Patients Stated Pain Goal: 3 (79/98/72 1587)  Complications:  Encounter Notable Events  Notable Event Outcome Phase Comment  Difficult to intubate - expected  Intraprocedure Filed from anesthesia note documentation.

## 2022-05-17 NOTE — Discharge Instructions (Signed)
Peritoneal Dialysis Catheter Placement, Care After The following information offers guidance on how to care for yourself after your procedure. Your health care provider may also give you more specific instructions. If you have problems or questions, contact your health care provider. What can I expect after the procedure? After the procedure, it is common to have some pain or discomfort in your abdomen and your incision area. You may need to wait 2 weeks after your procedure before you can start peritoneal dialysis treatment. If you need dialysis before that time, your health care provider may begin peritoneal dialysis treatment early or offer kidney dialysis treatments (hemodialysis) until you heal. Follow these instructions at home: Incision care  Follow instructions from your health care provider about how to take care of your incision or incisions. Make sure you: Wash your hands with soap and water for at least 20 seconds before and after you change your bandage (dressing). If soap and water are not available, use hand sanitizer. Change your dressing only as told by your health care provider. Your health care provider may tell you not to touch or change your dressing. Leave stitches (sutures), staples, skin glue, or adhesive strips in place. These skin closures may need to stay in place for 2 weeks or longer. If adhesive strip edges start to loosen and curl up, you may trim the loose edges. Do not remove adhesive strips completely unless your health care provider tells you to do that. Check your incision areas every day for signs of infection. If you were instructed not to touch or change your dressing, look at your dressing for signs of infection. Check for: Redness, swelling, or more pain. Fluid or blood. Warmth. Pus or a bad smell. Medicines Take over-the-counter and prescription medicines only as told by your health care provider. If you were prescribed an antibiotic medicine, use it as  told by your health care provider. Do not stop using the antibiotic even if you start to feel better. Ask your health care provider if the medicine prescribed to you requires you to avoid driving or using machinery. Driving Do not drive or ride in a car until your health care provider approves. Your seat belt could move the catheter out of position or cause irritation by rubbing on your incision. Activity  Rest and limit your activity. Do not lift anything that is heavier than 10 lb (4.5 kg), or the limit that you are told, until your health care provider says that it is safe. Return to your normal activities as told by your health care provider. Ask your health care provider what activities are safe for you. Managing constipation Your condition may cause constipation. To prevent or treat constipation, you may need to: Drink enough fluid to keep your urine pale yellow. Take over-the-counter or prescription medicines. Eat foods that are high in fiber, such as beans, whole grains, and fresh fruits and vegetables. Limit foods that are high in fat and processed sugars, such as fried or sweet foods. General instructions Do not use any products that contain nicotine or tobacco. These products include cigarettes, chewing tobacco, and vaping devices, such as e-cigarettes. If you need help quitting, ask your health care provider. Follow instructions from your health care provider about eating or drinking restrictions. Do not take baths, swim, or use a hot tub until your health care provider approves. Ask your health care provider if you may take showers. You may only be allowed to take sponge baths. Wear loose-fitting clothing that keeps   the catheter covered so that it cannot get caught on something. Keep your catheter clean and dry. Keep all follow-up visits. This is important. Contact a health care provider if: You have a fever or chills. You have warmth, redness, swelling, or more pain around an  incision. You have fluid or blood coming from an incision. You have pus or a bad smell coming from an incision. You cannot eat or drink without vomiting. Get help right away if: You have problems breathing. You are confused. You have trouble speaking. You have severe pain in your abdomen that does not get better with treatment. You have bright red blood in your stool (feces), or your stool is dark black and looks like tar. These symptoms may represent a serious problem that is an emergency. Do not wait to see if the symptoms will go away. Get medical help right away. Call your local emergency services (911 in the [place] Do not drive yourself to the hospital. Summary After the procedure, it is common to have some pain or discomfort in your abdomen, your incision area, or both. You may have to wait 2 weeks after your procedure before you can start peritoneal dialysis treatment. Check your incision area every day for signs of infection. Get medical help right away if you have severe pain in your abdomen that does not get better with treatment. This information is not intended to replace advice given to you by your health care provider. Make sure you discuss any questions you have with your health care provider.

## 2022-05-18 ENCOUNTER — Encounter (HOSPITAL_COMMUNITY): Payer: Self-pay | Admitting: Surgery

## 2022-05-18 ENCOUNTER — Telehealth: Payer: Self-pay

## 2022-05-18 NOTE — Telephone Encounter (Signed)
Pt's wife Wash Nienhaus called with c/o patient having "severe hiccups" all night and continuing today. They have tried several different remedies without any improvement. I have relayed this to Karoline Caldwell, [place] who was able to inform anesthesiologist. Per anesthesia, this can occur and usually self-resolves. Pt can try a reflux medication and deep breathing exercises. If it does not resolve, he has been advised to reach out to his PCP. Pt's wife is aware of the above and will call us if they need further assistance.

## 2022-05-19 ENCOUNTER — Other Ambulatory Visit: Payer: Self-pay | Admitting: Physician Assistant

## 2022-05-19 DIAGNOSIS — N185 Chronic kidney disease, stage 5: Secondary | ICD-10-CM

## 2022-05-19 MED ORDER — GABAPENTIN 100 MG PO CAPS: 100.0000 mg | ORAL_CAPSULE | Freq: Every day | ORAL | 0 refills | Status: DC

## 2022-05-22 ENCOUNTER — Telehealth: Payer: Self-pay

## 2022-05-22 ENCOUNTER — Ambulatory Visit (INDEPENDENT_AMBULATORY_CARE_PROVIDER_SITE_OTHER): Payer: BC Managed Care – PPO | Admitting: Surgery

## 2022-05-22 DIAGNOSIS — Z992 Dependence on renal dialysis: Secondary | ICD-10-CM

## 2022-05-22 DIAGNOSIS — N186 End stage renal disease: Secondary | ICD-10-CM

## 2022-05-22 NOTE — Telephone Encounter (Signed)
Melissa at [facility] called stating that the pt'd PD catheter started bleeding yesterday evening. It had now saturated through the dressing, onto his clothing. Asked questions and she gave the phone to the RN taking care of him. Spoke with Kasandra Knudsen, RN who explained that the dressing had not been taken all the way down to see how badly the area was bleeding at the moment. Pt denied any dizziness or feeling faint. No one placed pressure on the area or could tell me how much it was bleeding. Placed RN on hold to talk to PA.  Pt walked into clinic from next door and staff alerted me to the situation. Spoke with Melissa who was still on hold and explained that this is not the proper measures. She stated that she did not send the pt and his wife over to this office, but she could not stop them from leaving and coming here. Spoke to RN again to ensure that no one took down the entire dressing and he confirmed.  Placed pt and wife in exam room and removed dressing completely, per PA. Noted dried drainage and blood clot attached to PD tubing. Cleaned area surrounding insertion site. Saw no active bleeding, palpated surrounding area. Pt denied any pain, no bleeding noted. Lifted PD catheter off abd and noted a slight oozing at the insertion site. Placed gauze over insertion site and held pressure. No complaints from pt. Informed PA and pt's wife's insistence on seeing Dr Carollee Leitz, PA informed Dr Trula Slade to evaluate.

## 2022-05-22 NOTE — Progress Notes (Signed)
The patient is recently status post laparoscopic peritoneal dialysis catheter placement.  He had bloody drainage coming from his catheter exit site and so he walked over from dialysis to have this evaluated.  I removed some old hematoma.  I had him wait in the office for approximately 30 minutes.  There was no further bleeding.  Therefore I placed a sterile dressing.  I contemplated placing a suture around the catheter but elected not to since he has not had any other issues with bleeding.  He can go ahead and get his catheter flushed and contact us if he has any other issues.

## 2022-05-23 ENCOUNTER — Ambulatory Visit (INDEPENDENT_AMBULATORY_CARE_PROVIDER_SITE_OTHER): Payer: BC Managed Care – PPO | Admitting: Physician Assistant

## 2022-05-23 ENCOUNTER — Encounter: Payer: Self-pay | Admitting: Physician Assistant

## 2022-05-23 DIAGNOSIS — N186 End stage renal disease: Secondary | ICD-10-CM

## 2022-05-23 DIAGNOSIS — Z992 Dependence on renal dialysis: Secondary | ICD-10-CM

## 2022-05-23 NOTE — Progress Notes (Signed)
  POST OPERATIVE OFFICE NOTE    CC:  F/u for surgery  HPI:  This is a 46 y.o. male who is s/p  post laparoscopic peritoneal dialysis catheter placement by Dr. Trula Slade.  H was seen yesterday with bleeding around the exit site.  He returns today with continued bleeding from the site.  Pt returns today for follow up.  Pt states he has no other complaints at this time.     Allergies  Allergen Reactions   Norvasc [Amlodipine] Swelling   Shellfish Allergy Swelling   Zestril [Lisinopril] Swelling    Current Outpatient Medications  Medication Sig Dispense Refill   allopurinol (ZYLOPRIM) 300 MG tablet Take 300 mg by mouth in the morning.     atorvastatin (LIPITOR) 10 MG tablet Take 10 mg by mouth in the morning.     calcium acetate (PHOSLO) 667 MG capsule Take 1,334-2,001 mg by mouth See admin instructions. Take 3 capsules (2001 mg) by mouth with meals & take 2 capsules (1334 mg) by mouth with snacks.     gabapentin (NEURONTIN) 100 MG capsule Take 1 capsule (100 mg total) by mouth at bedtime. 20 capsule 0   glipiZIDE (GLUCOTROL XL) 10 MG 24 hr tablet Take 10 mg by mouth in the morning.     hydrALAZINE (APRESOLINE) 50 MG tablet Take 50 mg by mouth 2 (two) times daily.     latanoprost (XALATAN) 0.005 % ophthalmic solution Place 1 drop into both eyes at bedtime.     metoprolol succinate (TOPROL-XL) 100 MG 24 hr tablet Take 100 mg by mouth in the morning.     oxyCODONE-acetaminophen (PERCOCET) 5-325 MG tablet Take 1 tablet by mouth every 6 (six) hours as needed. 12 tablet 0   sodium bicarbonate 650 MG tablet Take 650 mg by mouth in the morning.     sodium zirconium cyclosilicate (LOKELMA) 10 g PACK packet Take 10 g by mouth every other day.     TRULICITY 1.5 OZ/2.2QM SOPN Inject 1.5 mg into the skin every Sunday at 6pm.     Vitamin D, Ergocalciferol, (DRISDOL) 1.25 MG (50000 UNIT) CAPS capsule Take 50,000 Units by mouth every Monday.     No current facility-administered medications for this  visit.     ROS:  See HPI  Physical Exam:    Incision:  skin edge bleeding around PD cath exit site.  Under clean conditions I prepped the skin with betadine followed by lidocaine 2 % 2 cc.  Once the skin was numb a 3 nylon stitch was secured beside the PD cath exit site.  No more bleeding was noted.  A dry dressing was applied.  Patient tolerated this well.     Assessment/Plan:  This is a 46 y.o. male who is s/p: post laparoscopic peritoneal dialysis catheter placement by Dr. Trula Slade.     F/u in 2-3 weeks for skin check and suture removal.  If he problems or concerns he will call our office.   Roxy Horseman PA-C [facility] [phone]   Clinic MD:  Stanford Breed

## 2022-06-05 ENCOUNTER — Telehealth: Payer: Self-pay

## 2022-06-05 NOTE — Telephone Encounter (Signed)
Langley Gauss at [facility] called stating that the pt's PD cath site was bleeding again. She changed the dressing at the beginning of the treatment. The dressing became saturated during treatment. She held pressure for approx 5 min and the pt left with instructions to hold pressure if it continued bleeding.

## 2022-06-12 ENCOUNTER — Ambulatory Visit (INDEPENDENT_AMBULATORY_CARE_PROVIDER_SITE_OTHER): Payer: BC Managed Care – PPO | Admitting: Physician Assistant

## 2022-06-12 VITALS — BP 138/91 | HR 92 | Temp 98.3°F | Resp 20 | Ht 70.0 in | Wt 230.5 lb

## 2022-06-12 DIAGNOSIS — N186 End stage renal disease: Secondary | ICD-10-CM

## 2022-06-12 DIAGNOSIS — Z992 Dependence on renal dialysis: Secondary | ICD-10-CM

## 2022-06-12 NOTE — Progress Notes (Signed)
  POST OPERATIVE OFFICE NOTE    CC:  F/u for surgery  HPI:  This is a 46 y.o. male who is s/p placement of peritoneal dialysis catheter by Dr. Trula Slade on 05/17/2022.  Postoperatively he experienced bleeding from the catheter exit site which required a nylon suture performed in the office on 05/23/2022.  He returns today for suture removal.  He has occasional spotting on the dressing covering his catheter however is no longer experiencing the bleeding that he had postoperatively.  Today he has his first class to learn how to use the PD catheter at home.  Allergies  Allergen Reactions   Norvasc [Amlodipine] Swelling   Shellfish Allergy Swelling   Zestril [Lisinopril] Swelling    Current Outpatient Medications  Medication Sig Dispense Refill   allopurinol (ZYLOPRIM) 300 MG tablet Take 300 mg by mouth in the morning.     atorvastatin (LIPITOR) 10 MG tablet Take 10 mg by mouth in the morning.     calcium acetate (PHOSLO) 667 MG capsule Take 1,334-2,001 mg by mouth See admin instructions. Take 3 capsules (2001 mg) by mouth with meals & take 2 capsules (1334 mg) by mouth with snacks.     gabapentin (NEURONTIN) 100 MG capsule Take 1 capsule (100 mg total) by mouth at bedtime. 20 capsule 0   glipiZIDE (GLUCOTROL XL) 10 MG 24 hr tablet Take 10 mg by mouth in the morning.     hydrALAZINE (APRESOLINE) 50 MG tablet Take 50 mg by mouth 2 (two) times daily.     latanoprost (XALATAN) 0.005 % ophthalmic solution Place 1 drop into both eyes at bedtime.     metoprolol succinate (TOPROL-XL) 100 MG 24 hr tablet Take 100 mg by mouth in the morning.     oxyCODONE-acetaminophen (PERCOCET) 5-325 MG tablet Take 1 tablet by mouth every 6 (six) hours as needed. 12 tablet 0   sodium bicarbonate 650 MG tablet Take 650 mg by mouth in the morning.     sodium zirconium cyclosilicate (LOKELMA) 10 g PACK packet Take 10 g by mouth every other day.     TRULICITY 1.5 GY/6.9SW SOPN Inject 1.5 mg into the skin every Sunday at  6pm.     Vitamin D, Ergocalciferol, (DRISDOL) 1.25 MG (50000 UNIT) CAPS capsule Take 50,000 Units by mouth every Monday.     No current facility-administered medications for this visit.     ROS:  See HPI  Physical Exam:  Vitals:   06/12/22 0934  BP: (!) 138/91  Pulse: 92  Resp: 20  Temp: 98.3 F (36.8 C)  TempSrc: Temporal  SpO2: 99%  Weight: 230 lb 8 oz (104.6 kg)  Height: 5\' 10"  (1.778 m)    Incision: Abdomen incisions all well-healed Neuro: A&O  Assessment/Plan:  This is a 46 y.o. male who is s/p: PD catheter placement with postoperative skin bleeding at catheter exit site  -No further bleeding noted on exam -Suture removed -Patient will follow-up on an as-needed basis.  Ok to use PD catheter at any time   Dagoberto Ligas, PA-C [facility] [phone]  Clinic MD:  Trula Slade

## 2023-07-07 ENCOUNTER — Other Ambulatory Visit: Payer: Self-pay

## 2023-07-07 ENCOUNTER — Emergency Department (HOSPITAL_COMMUNITY): Payer: BC Managed Care – PPO

## 2023-07-07 ENCOUNTER — Encounter (HOSPITAL_COMMUNITY): Payer: Self-pay | Admitting: Emergency Medicine

## 2023-07-07 ENCOUNTER — Inpatient Hospital Stay (HOSPITAL_COMMUNITY)
Admission: EM | Admit: 2023-07-07 | Discharge: 2023-07-10 | DRG: 867 | Disposition: A | Discharge status: Home / Self Care | Payer: BC Managed Care – PPO | Attending: Internal Medicine | Admitting: Internal Medicine

## 2023-07-07 DIAGNOSIS — E1122 Type 2 diabetes mellitus with diabetic chronic kidney disease: Secondary | ICD-10-CM | POA: Diagnosis present

## 2023-07-07 DIAGNOSIS — K658 Other peritonitis: Secondary | ICD-10-CM | POA: Diagnosis present

## 2023-07-07 DIAGNOSIS — Z6833 Body mass index (BMI) 33.0-33.9, adult: Secondary | ICD-10-CM

## 2023-07-07 DIAGNOSIS — Y841 Kidney dialysis as the cause of abnormal reaction of the patient, or of later complication, without mention of misadventure at the time of the procedure: Secondary | ICD-10-CM | POA: Diagnosis present

## 2023-07-07 DIAGNOSIS — Z91013 Allergy to seafood: Secondary | ICD-10-CM

## 2023-07-07 DIAGNOSIS — K769 Liver disease, unspecified: Secondary | ICD-10-CM | POA: Diagnosis present

## 2023-07-07 DIAGNOSIS — M109 Gout, unspecified: Secondary | ICD-10-CM | POA: Diagnosis present

## 2023-07-07 DIAGNOSIS — T8029XA Infection following other infusion, transfusion and therapeutic injection, initial encounter: Secondary | ICD-10-CM | POA: Diagnosis not present

## 2023-07-07 DIAGNOSIS — E785 Hyperlipidemia, unspecified: Secondary | ICD-10-CM | POA: Diagnosis present

## 2023-07-07 DIAGNOSIS — Z7985 Long-term (current) use of injectable non-insulin antidiabetic drugs: Secondary | ICD-10-CM

## 2023-07-07 DIAGNOSIS — R1084 Generalized abdominal pain: Principal | ICD-10-CM

## 2023-07-07 DIAGNOSIS — Z79899 Other long term (current) drug therapy: Secondary | ICD-10-CM

## 2023-07-07 DIAGNOSIS — N186 End stage renal disease: Secondary | ICD-10-CM | POA: Diagnosis present

## 2023-07-07 DIAGNOSIS — D631 Anemia in chronic kidney disease: Secondary | ICD-10-CM | POA: Diagnosis present

## 2023-07-07 DIAGNOSIS — T8571XA Infection and inflammatory reaction due to peritoneal dialysis catheter, initial encounter: Secondary | ICD-10-CM | POA: Diagnosis present

## 2023-07-07 DIAGNOSIS — I120 Hypertensive chronic kidney disease with stage 5 chronic kidney disease or end stage renal disease: Secondary | ICD-10-CM | POA: Diagnosis present

## 2023-07-07 DIAGNOSIS — E66811 Obesity, class 1: Secondary | ICD-10-CM | POA: Diagnosis present

## 2023-07-07 DIAGNOSIS — Z992 Dependence on renal dialysis: Secondary | ICD-10-CM

## 2023-07-07 DIAGNOSIS — E8770 Fluid overload, unspecified: Secondary | ICD-10-CM | POA: Diagnosis present

## 2023-07-07 DIAGNOSIS — Z888 Allergy status to other drugs, medicaments and biological substances status: Secondary | ICD-10-CM

## 2023-07-07 DIAGNOSIS — N250 Renal osteodystrophy: Secondary | ICD-10-CM | POA: Diagnosis present

## 2023-07-07 DIAGNOSIS — R188 Other ascites: Secondary | ICD-10-CM | POA: Diagnosis present

## 2023-07-07 DIAGNOSIS — Z8616 Personal history of COVID-19: Secondary | ICD-10-CM

## 2023-07-07 DIAGNOSIS — Z7984 Long term (current) use of oral hypoglycemic drugs: Secondary | ICD-10-CM

## 2023-07-07 DIAGNOSIS — Z833 Family history of diabetes mellitus: Secondary | ICD-10-CM

## 2023-07-07 DIAGNOSIS — Z91041 Radiographic dye allergy status: Secondary | ICD-10-CM

## 2023-07-07 DIAGNOSIS — Z560 Unemployment, unspecified: Secondary | ICD-10-CM

## 2023-07-07 HISTORY — DX: End stage renal disease: Z99.2

## 2023-07-07 HISTORY — DX: End stage renal disease: N18.6

## 2023-07-07 LAB — I-STAT CG4 LACTIC ACID, ED
Lactic Acid, Venous: 1.1 mmol/L (ref 0.5–1.9)
Lactic Acid, Venous: 0.7 mmol/L (ref 0.5–1.9)

## 2023-07-07 LAB — CBC WITH DIFFERENTIAL/PLATELET
Abs Immature Granulocytes: 0.06 10*3/uL (ref 0.00–0.07)
Basophils Absolute: 0 10*3/uL (ref 0.0–0.1)
Basophils Relative: 0 %
Eosinophils Absolute: 0 10*3/uL (ref 0.0–0.5)
Eosinophils Relative: 0 %
HCT: 29.2 % — ABNORMAL LOW (ref 39.0–52.0)
Hemoglobin: 9.4 g/dL — ABNORMAL LOW (ref 13.0–17.0)
Immature Granulocytes: 1 %
Lymphocytes Relative: 12 %
Lymphs Abs: 1 10*3/uL (ref 0.7–4.0)
MCH: 31.5 pg (ref 26.0–34.0)
MCHC: 32.2 g/dL (ref 30.0–36.0)
MCV: 98 fL (ref 80.0–100.0)
Monocytes Absolute: 0.5 10*3/uL (ref 0.1–1.0)
Monocytes Relative: 7 %
Neutro Abs: 6.3 10*3/uL (ref 1.7–7.7)
Neutrophils Relative %: 80 %
Platelets: 220 10*3/uL (ref 150–400)
RBC: 2.98 MIL/uL — ABNORMAL LOW (ref 4.22–5.81)
RDW: 13.5 % (ref 11.5–15.5)
WBC: 7.9 10*3/uL (ref 4.0–10.5)
nRBC: 0 % (ref 0.0–0.2)

## 2023-07-07 LAB — URINALYSIS, W/ REFLEX TO CULTURE (INFECTION SUSPECTED)
Bilirubin Urine: NEGATIVE
Glucose, UA: 50 mg/dL — AB
Ketones, ur: NEGATIVE mg/dL
Nitrite: NEGATIVE
Protein, ur: 100 mg/dL — AB
Specific Gravity, Urine: 1.01 (ref 1.005–1.030)
pH: 6 (ref 5.0–8.0)

## 2023-07-07 LAB — COMPREHENSIVE METABOLIC PANEL
ALT: 13 U/L (ref 0–44)
AST: 7 U/L — ABNORMAL LOW (ref 15–41)
Albumin: 3.6 g/dL (ref 3.5–5.0)
Alkaline Phosphatase: 48 U/L (ref 38–126)
Anion gap: 20 — ABNORMAL HIGH (ref 5–15)
BUN: 99 mg/dL — ABNORMAL HIGH (ref 6–20)
CO2: 19 mmol/L — ABNORMAL LOW (ref 22–32)
Calcium: 7.8 mg/dL — ABNORMAL LOW (ref 8.9–10.3)
Chloride: 99 mmol/L (ref 98–111)
Creatinine, Ser: 23.6 mg/dL — ABNORMAL HIGH (ref 0.61–1.24)
GFR, Estimated: 2 mL/min — ABNORMAL LOW (ref >60)
Glucose, Bld: 140 mg/dL — ABNORMAL HIGH (ref 70–99)
Potassium: 4.6 mmol/L (ref 3.5–5.1)
Sodium: 138 mmol/L (ref 135–145)
Total Bilirubin: 0.7 mg/dL (ref <1.2)
Total Protein: 7.3 g/dL (ref 6.5–8.1)

## 2023-07-07 LAB — GLUCOSE, CAPILLARY: Glucose-Capillary: 110 mg/dL — ABNORMAL HIGH (ref 70–99)

## 2023-07-07 LAB — PHOSPHORUS: Phosphorus: 10.9 mg/dL — ABNORMAL HIGH (ref 2.5–4.6)

## 2023-07-07 LAB — CBG MONITORING, ED: Glucose-Capillary: 108 mg/dL — ABNORMAL HIGH (ref 70–99)

## 2023-07-07 MED ORDER — FERRIC CITRATE 1 GM 210 MG(FE) PO TABS
630.0000 mg | ORAL_TABLET | Freq: Three times a day (TID) | ORAL | Status: DC
  Administered 2023-07-07 – 2023-07-10 (×5): 630 mg via ORAL
  Filled 2023-07-07 (×9): qty 3

## 2023-07-07 MED ORDER — SODIUM CHLORIDE 0.9% FLUSH
3.0000 mL | Freq: Two times a day (BID) | INTRAVENOUS | Status: DC
  Administered 2023-07-07 – 2023-07-10 (×6): 3 mL via INTRAVENOUS

## 2023-07-07 MED ORDER — GENTAMICIN SULFATE 0.1 % EX CREA
1.0000 | TOPICAL_CREAM | Freq: Every day | CUTANEOUS | Status: DC
  Administered 2023-07-08 – 2023-07-10 (×4): 1 via TOPICAL
  Filled 2023-07-07: qty 15

## 2023-07-07 MED ORDER — HEPARIN SODIUM (PORCINE) 5000 UNIT/ML IJ SOLN
5000.0000 [IU] | Freq: Three times a day (TID) | INTRAMUSCULAR | Status: DC
  Administered 2023-07-07 – 2023-07-10 (×9): 5000 [IU] via SUBCUTANEOUS
  Filled 2023-07-07 (×10): qty 1

## 2023-07-07 MED ORDER — CALCITRIOL 0.5 MCG PO CAPS
1.0000 ug | ORAL_CAPSULE | Freq: Every evening | ORAL | Status: DC
  Administered 2023-07-07 – 2023-07-09 (×2): 1 ug via ORAL
  Filled 2023-07-07 (×2): qty 2
  Filled 2023-07-07: qty 4
  Filled 2023-07-07: qty 2
  Filled 2023-07-07: qty 4

## 2023-07-07 MED ORDER — HYDROMORPHONE HCL 1 MG/ML PO LIQD: 1.0000 mg | Freq: Four times a day (QID) | ORAL | Status: DC | PRN

## 2023-07-07 MED ORDER — TRIMETHOBENZAMIDE HCL 100 MG/ML IM SOLN
200.0000 mg | Freq: Once | INTRAMUSCULAR | Status: AC
  Administered 2023-07-07: 200 mg via INTRAMUSCULAR
  Filled 2023-07-07: qty 2

## 2023-07-07 MED ORDER — VANCOMYCIN VARIABLE DOSE PER UNSTABLE RENAL FUNCTION (PHARMACIST DOSING): Status: DC

## 2023-07-07 MED ORDER — HYDROMORPHONE HCL 1 MG/ML IJ SOLN
0.5000 mg | Freq: Once | INTRAMUSCULAR | Status: AC
  Administered 2023-07-07: 0.5 mg via INTRAVENOUS
  Filled 2023-07-07: qty 1

## 2023-07-07 MED ORDER — ONDANSETRON HCL 4 MG/2ML IJ SOLN
4.0000 mg | Freq: Once | INTRAMUSCULAR | Status: AC
  Administered 2023-07-07: 4 mg via INTRAVENOUS
  Filled 2023-07-07: qty 2

## 2023-07-07 MED ORDER — HEPARIN SODIUM (PORCINE) 1000 UNIT/ML IJ SOLN
INTRAPERITONEAL | Status: DC | PRN
  Filled 2023-07-07 (×8): qty 6000

## 2023-07-07 MED ORDER — SODIUM CHLORIDE 0.9 % IV SOLN
1.0000 g | Freq: Once | INTRAVENOUS | Status: AC
  Administered 2023-07-07: 1 g via INTRAVENOUS
  Filled 2023-07-07: qty 1

## 2023-07-07 MED ORDER — DELFLEX-LC/1.5% DEXTROSE 344 MOSM/L IP SOLN: INTRAPERITONEAL | Status: DC

## 2023-07-07 MED ORDER — SENNOSIDES-DOCUSATE SODIUM 8.6-50 MG PO TABS: 1.0000 | ORAL_TABLET | Freq: Every evening | ORAL | Status: DC | PRN

## 2023-07-07 MED ORDER — HYDROMORPHONE HCL 1 MG/ML IJ SOLN
1.0000 mg | Freq: Once | INTRAMUSCULAR | Status: AC
  Administered 2023-07-07: 1 mg via INTRAVENOUS
  Filled 2023-07-07: qty 1

## 2023-07-07 MED ORDER — ATORVASTATIN CALCIUM 10 MG PO TABS
10.0000 mg | ORAL_TABLET | Freq: Every morning | ORAL | Status: DC
  Administered 2023-07-08 – 2023-07-10 (×3): 10 mg via ORAL
  Filled 2023-07-07 (×3): qty 1

## 2023-07-07 MED ORDER — VANCOMYCIN HCL 2000 MG/400ML IV SOLN
2000.0000 mg | Freq: Once | INTRAVENOUS | Status: AC
  Administered 2023-07-07: 2000 mg via INTRAVENOUS
  Filled 2023-07-07: qty 400

## 2023-07-07 MED ORDER — ACETAMINOPHEN 500 MG PO TABS: 1000.0000 mg | ORAL_TABLET | Freq: Four times a day (QID) | ORAL | Status: DC | PRN

## 2023-07-07 MED ORDER — MORPHINE SULFATE (PF) 4 MG/ML IV SOLN
4.0000 mg | Freq: Once | INTRAVENOUS | Status: AC
  Administered 2023-07-07: 4 mg via INTRAVENOUS
  Filled 2023-07-07: qty 1

## 2023-07-07 MED ORDER — ACETAMINOPHEN 500 MG PO TABS: 1000.0000 mg | ORAL_TABLET | Freq: Three times a day (TID) | ORAL | Status: DC

## 2023-07-07 MED ORDER — HYDROMORPHONE HCL 2 MG PO TABS
1.0000 mg | ORAL_TABLET | Freq: Four times a day (QID) | ORAL | Status: DC | PRN
  Administered 2023-07-07: 1 mg via ORAL
  Filled 2023-07-07: qty 1

## 2023-07-07 MED ORDER — SODIUM CHLORIDE 0.9 % IV SOLN
1.0000 g | INTRAVENOUS | Status: DC
  Administered 2023-07-08: 1 g via INTRAVENOUS
  Filled 2023-07-07: qty 1

## 2023-07-07 MED ORDER — GENTAMICIN SULFATE 0.1 % EX CREA: 1.0000 | TOPICAL_CREAM | Freq: Every day | CUTANEOUS | Status: DC

## 2023-07-07 MED ORDER — LATANOPROST 0.005 % OP SOLN
1.0000 [drp] | Freq: Every day | OPHTHALMIC | Status: DC
  Administered 2023-07-07 – 2023-07-09 (×3): 1 [drp] via OPHTHALMIC
  Filled 2023-07-07: qty 2.5

## 2023-07-07 NOTE — Progress Notes (Signed)
Introduced self to patient,explained my being with him and the procedure,if he he is dry,need to instill ,dwell and collect peritoneal fluid after 4 hours.Patient awake,alert and oriented x 4. He signed his own consent to procedure.Procedure done and completed ,patient tolerated,not in distress.Procedure done on sterile set-up from the start till completed.

## 2023-07-07 NOTE — ED Provider Notes (Signed)
[place] EMERGENCY DEPARTMENT AT [facility] Provider Note   CSN: [id number] Arrival date & time: 07/07/23  7829     History  Chief Complaint  Patient presents with   Abdominal Pain   Emesis    Joshua Mullins is a 47 y.o. male.  47 year old male with prior medical history as detailed below presents for evaluation.  Patient complains of worsening abdominal pain x 3 to 4 days.  Patient is on home peritoneal dialysis.  Patient reports that his fluid from his catheter was cloudy last night.  He reports associated nausea and vomiting.  He denies fevers.  The history is provided by the patient and medical records.       Home Medications Prior to Admission medications   Medication Sig Start Date End Date Taking? Authorizing Provider  allopurinol (ZYLOPRIM) 300 MG tablet Take 300 mg by mouth in the morning. 10/21/20   [provider]  atorvastatin (LIPITOR) 10 MG tablet Take 10 mg by mouth in the morning. 11/22/20   [provider]  calcium acetate (PHOSLO) 667 MG capsule Take 1,334-2,001 mg by mouth See admin instructions. Take 3 capsules (2001 mg) by mouth with meals & take 2 capsules (1334 mg) by mouth with snacks. 11/19/20   [provider]  gabapentin (NEURONTIN) 100 MG capsule Take 1 capsule (100 mg total) by mouth at bedtime. 05/19/22   Lars Mage, PA-C  glipiZIDE (GLUCOTROL XL) 10 MG 24 hr tablet Take 10 mg by mouth in the morning. 10/20/20   [provider]  hydrALAZINE (APRESOLINE) 50 MG tablet Take 50 mg by mouth 2 (two) times daily. 11/29/21   [provider]  latanoprost (XALATAN) 0.005 % ophthalmic solution Place 1 drop into both eyes at bedtime. 12/07/20   [provider]  metoprolol succinate (TOPROL-XL) 100 MG 24 hr tablet Take 100 mg by mouth in the morning. 12/17/20   [provider]  oxyCODONE-acetaminophen (PERCOCET) 5-325 MG tablet Take 1 tablet by mouth every 6 (six) hours as needed. 05/17/22    Rhyne, Ames Coupe, PA-C  sodium bicarbonate 650 MG tablet Take 650 mg by mouth in the morning. 01/12/22   [provider]  sodium zirconium cyclosilicate (LOKELMA) 10 g PACK packet Take 10 g by mouth every other day.    [provider]  TRULICITY 1.5 MG/0.5ML SOPN Inject 1.5 mg into the skin every Sunday at 6pm. 11/19/20   [provider]  Vitamin D, Ergocalciferol, (DRISDOL) 1.25 MG (50000 UNIT) CAPS capsule Take 50,000 Units by mouth every Monday. 11/22/20   [provider]      Allergies    Norvasc [amlodipine], Shellfish allergy, and Zestril [lisinopril]    Review of Systems   Review of Systems  All other systems reviewed and are negative.   Physical Exam Updated Vital Signs BP (!) 181/114 (BP Location: Right Arm)   Pulse (!) 106   Temp 99.8 F (37.7 C) (Oral)   Resp (!) 22   Ht 5\' 10"  (1.778 m)   Wt 102.1 kg   SpO2 99%   BMI 32.28 kg/m  Physical Exam Vitals and nursing note reviewed.  Constitutional:      General: He is not in acute distress.    Appearance: Normal appearance. He is well-developed.  HENT:     Head: Normocephalic and atraumatic.  Eyes:     Conjunctiva/sclera: Conjunctivae normal.     Pupils: Pupils are equal, round, and reactive to light.  Cardiovascular:  Rate and Rhythm: Normal rate and regular rhythm.     Heart sounds: Normal heart sounds.  Pulmonary:     Effort: Pulmonary effort is normal. No respiratory distress.     Breath sounds: Normal breath sounds.  Abdominal:     General: There is no distension.     Palpations: Abdomen is soft.     Tenderness: There is generalized abdominal tenderness.     Comments: Peritoneal catheter in place in the left upper quadrant.  Diffuse tenderness appreciated with palpation of the entire abdomen.  Musculoskeletal:        General: No deformity. Normal range of motion.     Cervical back: Normal range of motion and neck supple.  Skin:    General: Skin is warm and dry.   Neurological:     General: No focal deficit present.     Mental Status: He is alert and oriented to person, place, and time.     ED Results / Procedures / Treatments   Labs (all labs ordered are listed, but only abnormal results are displayed) Labs Reviewed  CBC WITH DIFFERENTIAL/PLATELET - Abnormal; Notable for the following components:      Result Value   RBC 2.98 (*)    Hemoglobin 9.4 (*)    HCT 29.2 (*)    All other components within normal limits  CULTURE, BLOOD (ROUTINE X 2)  CULTURE, BLOOD (ROUTINE X 2)  COMPREHENSIVE METABOLIC PANEL  URINALYSIS, W/ REFLEX TO CULTURE (INFECTION SUSPECTED)  I-STAT CG4 LACTIC ACID, ED    EKG None  Radiology DG Chest 2 View  Result Date: 07/07/2023 CLINICAL DATA:  Sepsis EXAM: CHEST - 2 VIEW COMPARISON:  08/02/2006 FINDINGS: Normal heart size and mediastinal contours. No acute infiltrate or edema. No effusion or pneumothorax. No acute osseous findings. IMPRESSION: Negative chest. Electronically Signed   By: Tiburcio Pea M.D.   On: 07/07/2023 07:57    Procedures Procedures    Medications Ordered in ED Medications - No data to display  ED Course/ Medical Decision Making/ A&P                                 Medical Decision Making Amount and/or Complexity of Data Reviewed Labs: ordered. Radiology: ordered.  Risk Prescription drug management.    Medical Screen Complete  This patient presented to the ED with complaint of abdominal pain.  This complaint involves an extensive number of treatment options. The initial differential diagnosis includes, but is not limited to, suspected peritonitis related to peritoneal dialysis, metabolic abnormality, etc.  This presentation is: Acute, Chronic, Self-Limited, Previously Undiagnosed, Uncertain Prognosis, Complicated, Systemic Symptoms, and Threat to Life/Bodily Function  Patient with known history of ESRD on PD.  Patient presents with complaint of increased abdominal pain and  cloudy fluid from PD catheter.  History is concerning for likely peritonitis.  Case discussed with Dr. Arlean Hopping (nephrology) who agrees with plan of care.  Dialysis nurses will come and draw PD fluid for culture.  Patient would benefit from admission.  Additional history obtained:  External records from outside sources obtained and reviewed including prior ED visits and prior Inpatient records.    Problem List / ED Course:  Abdominal pain, suspected peritonitis   Reevaluation:  After the interventions noted above, I reevaluated the patient and found that they have: improved   Disposition:  After consideration of the diagnostic results and the patients response to treatment, I feel that  the patent would benefit from admission.   CRITICAL CARE Performed by: Wynetta Fines   Total critical care time: 30 minutes  Critical care time was exclusive of separately billable procedures and treating other patients.  Critical care was necessary to treat or prevent imminent or life-threatening deterioration.  Critical care was time spent personally by me on the following activities: development of treatment plan with patient and/or surrogate as well as nursing, discussions with consultants, evaluation of patient's response to treatment, examination of patient, obtaining history from patient or surrogate, ordering and performing treatments and interventions, ordering and review of laboratory studies, ordering and review of radiographic studies, pulse oximetry and re-evaluation of patient's condition.          Final Clinical Impression(s) / ED Diagnoses Final diagnoses:  None    Rx / DC Orders ED Discharge Orders     None         Wynetta Fines, MD 07/07/23 [phone]

## 2023-07-07 NOTE — Consult Note (Addendum)
Renal Service Consult Note [facility]  Joshua Mullins 07/07/2023 Maree Krabbe, MD Requesting Physician: Dr. Mayford Knife, [facility].   Reason for Consult: ESRD pt w/ cloudy PD fluid and abd pain  HPI: The patient is a 47 y.o. year-old w/ PMH as below who presented to ED today to ED c/o sudden onset gen'd abd pain during his PD this morning. He has been having milder abd pains for the last few days, but this was unbearable. Vomited several times this am. Has ESRD and is on PD nightly since 2023. He stated the fluid was cloudy also overnight for the 1st time. Pt was given IV morphine sulfate, IV zofran, IV fortaz and IV vanc. Pt is to be admitted. We are asked to see for dialysis.   Pt seen in room. Wife at bedside. He has been having a vague mild abd pain for the past 3-4 days. PD fluid has been clean. He missed one PD session on Tuesday night. Last night when he was dialyzing on the 2nd exchange he developed a severe diffuse abd pain and had to take himself off the machine. The pain persisted. He also noted cloudy fluid which was new. No hx peritonitis. He came here. Got morphine and pain went from 12 to 7, still sig uncomfortable.   Has L forearm placed in 2023, never used, never tried HD.   Denies any diarrhea, SOB.    ROS - denies CP, no joint pain, no HA, no blurry vision, no rash, no dysuria, no difficulty voiding   Past Medical History  Past Medical History:  Diagnosis Date   Chronic kidney disease    Diabetes mellitus without complication (HCC)    Type II   Hyperlipidemia    Hypertension    Sleep apnea    Uses a cpap   Past Surgical History  Past Surgical History:  Procedure Laterality Date   AV FISTULA PLACEMENT Left 07/21/2021   Procedure: LEFT ARM FISTULA CREATION;  Surgeon: Nada Libman, MD;  Location: [facility] OR;  Service: Vascular;  Laterality: Left;   AV FISTULA PLACEMENT Left 01/05/2022   Procedure: ARTERIOVENOUS (AV) FISTULA REVISION AND ELEVATION LEFT  ARM;  Surgeon: Nada Libman, MD;  Location: [facility] OR;  Service: Vascular;  Laterality: Left;   CAPD INSERTION N/A 05/17/2022   Procedure: LAPAROSCOPIC INSERTION CONTINUOUS AMBULATORY PERITONEAL DIALYSIS  (CAPD) CATHETER;  Surgeon: Nada Libman, MD;  Location: [facility] OR;  Service: Vascular;  Laterality: N/A;   Family History History reviewed. No pertinent family history. Social History  reports that he has never smoked. He has never been exposed to tobacco smoke. He has never used smokeless tobacco. He reports that he does not drink alcohol and does not use drugs. Allergies  Allergies  Allergen Reactions   Norvasc [Amlodipine] Swelling   Shellfish Allergy Swelling   Zestril [Lisinopril] Swelling   Home medications Prior to Admission medications   Medication Sig Start Date End Date Taking? Authorizing Provider  allopurinol (ZYLOPRIM) 300 MG tablet Take 300 mg by mouth in the morning. 10/21/20   [provider]  atorvastatin (LIPITOR) 10 MG tablet Take 10 mg by mouth in the morning. 11/22/20   [provider]  calcium acetate (PHOSLO) 667 MG capsule Take 1,334-2,001 mg by mouth See admin instructions. Take 3 capsules (2001 mg) by mouth with meals & take 2 capsules (1334 mg) by mouth with snacks. 11/19/20   [provider]  gabapentin (NEURONTIN) 100 MG capsule Take 1 capsule (100  mg total) by mouth at bedtime. 05/19/22   Lars Mage, PA-C  glipiZIDE (GLUCOTROL XL) 10 MG 24 hr tablet Take 10 mg by mouth in the morning. 10/20/20   [provider]  hydrALAZINE (APRESOLINE) 50 MG tablet Take 50 mg by mouth 2 (two) times daily. 11/29/21   [provider]  latanoprost (XALATAN) 0.005 % ophthalmic solution Place 1 drop into both eyes at bedtime. 12/07/20   [provider]  metoprolol succinate (TOPROL-XL) 100 MG 24 hr tablet Take 100 mg by mouth in the morning. 12/17/20   [provider]  oxyCODONE-acetaminophen (PERCOCET) 5-325 MG tablet Take 1  tablet by mouth every 6 (six) hours as needed. 05/17/22   Rhyne, Ames Coupe, PA-C  sodium bicarbonate 650 MG tablet Take 650 mg by mouth in the morning. 01/12/22   [provider]  sodium zirconium cyclosilicate (LOKELMA) 10 g PACK packet Take 10 g by mouth every other day.    [provider]  TRULICITY 1.5 MG/0.5ML SOPN Inject 1.5 mg into the skin every Sunday at 6pm. 11/19/20   [provider]  Vitamin D, Ergocalciferol, (DRISDOL) 1.25 MG (50000 UNIT) CAPS capsule Take 50,000 Units by mouth every Monday. 11/22/20   [provider]     Vitals:   07/07/23 0732 07/07/23 0830 07/07/23 0845 07/07/23 1000  BP:  (!) 162/88 (!) 147/87 (!) 155/94  Pulse:  (!) 101 (!) 102 97  Resp:  15 18 15   Temp:      TempSrc:      SpO2:  98% 95% 94%  Weight: 102.1 kg     Height: 5\' 10"  (1.778 m)      Exam Gen alert, no distress, having moderate pain No rash, cyanosis or gangrene Sclera anicteric, throat clear  No jvd or bruits Chest clear bilat to bases, no rales/ wheezing RRR no RG Abd soft obese ntnd no mass or ascites +bs GU nl male MS no joint effusions or deformity Ext 1+ pretib edema, no wounds or ulcers Neuro is alert, Ox 3 , nf    PD cath LUQ intact, clean exit     Renal-related home meds: - auryxia 3 ac - rocaltrol 0.5 mcg take 2 at bedtime - phoslo 3 ac tid - gabapentin 100 hs - hydralazine 50 bid - toprol xl 100mg  qam - sod bicarb 1 hs    OP PD: [facility] Dr Ronalee Belts  5 exchanges, 3 L fill, 1.5 hr dwell, dry wt 105kg   - uses mostly 2.5's and some 1.5s - missed 1 session earlier this week , and last night had 1/5 exchanges   Assessment/ Plan: Abd pain - worsening during the week, new onset cloudy fluid w/ PD run last night. Suspected PD cath-related peritonitis. Recommend empiric IV fortaz and IV vanc. We will obtain fluid specimens some time today and send for cell ct and culture. Pain control per pmd, use dilaudid or fentanyl for IV pain meds w/  esrd pts.  ESKD - on PD started Oct 2023. Has L forearm AVF never used, functioning. Plan is for [facility]CP[facility] tonight. Will add heparin to bags.  HTN - BP's up a bit. Home meds prn per pmd.  Volume - mild vol overload on exam. Use all 2.5% bags Anemia of eskd - Hb 8- 10 range, follow, transfuse prn.  MBD ckd - CCa on the low side. Cont po vdra. Add on phos. Get binder info.     Vinson Moselle  MD [facility] 07/07/2023, 11:38  AM  Recent Labs  Lab 07/07/23 0738  HGB 9.4*  ALBUMIN 3.6  CALCIUM 7.8*  CREATININE 23.60*  K 4.6   Inpatient medications:  gentamicin cream  1 Application Topical Daily   heparin  5,000 Units Subcutaneous Q8H   sodium chloride flush  3 mL Intravenous Q12H    dialysis solution 1.5% low-MG/low-CA     vancomycin 2,000 mg (07/07/23 1017)   acetaminophen, HYDROmorphone HCl, senna-docusate

## 2023-07-07 NOTE — ED Notes (Signed)
Admitting at bedside 

## 2023-07-07 NOTE — Hospital Course (Addendum)
Peritoneal dialysis catheter-associated peritonitis Patient with long term use of peritoneal dialysis presenting to the ED after 24 hours of abdominal pain, cloudy fluid from PD catheter, nausea, and vomiting. On evaluation. Patient remained afebrile and normotensive on admission. No leukocytosis. Blood cultures were obtained and he was started on vancomycin and ceftazidime. PD catheter fluid analysis with 22K cells, 83% neutrophils. *** peritoneal fluid cultures and susceptibilities.  [ ]  Pain controlled with scheduled tylenol 1000 q6 [ ]  IV 1mg  dilaudid every 4 hours - will fall off    #ESRD on PD daily Hx of ESRD on home PD daily since Oct 2023. Last successful dialysis on 7/14. Has LUE fistula that is patent but has never been used.  Follows up with Dr Ronalee Belts of [facility]. No prior episode of peritonitis in the past. Received PD during this hospitalization *** Renal medications - auryxia 3 ac - continued - rocaltrol 0.5 mcg take 2 at bedtime - continued - phoslo 3 ac tid - sod bicarb 1 hs - mentions he does not take anymore  #HTN Holding home antihypertensives as blood pressure is controlled - hydralazine 50 bid  - toprol xl 100mg  qam  #Anemia of ESRD Baseline in 8-10 range.Hgb of 9.4 on admission. Slight downtrend since admission. No signs of bleeding. Smear and iron labs pending ***. S/p Aranesp on 11/17.  HLD Continued on Lipitor 10 mg  Type 2 DM Last A1c 5.4 24 April 2023. Patient is off all diabetic medications  Gout Holding Allopurinol  #Liver Lesion  CT showed Benign-appearing peripherally calcified lesion in segment 4A of the liver, incompletely characterized, but similar in size to prior abdominal MRI 04/30/2018, presumably benign (potentially related to remote trauma).

## 2023-07-07 NOTE — ED Notes (Signed)
Follow up call made to HD.  They cannot send someone at this time.  EDP aware.

## 2023-07-07 NOTE — Progress Notes (Signed)
Pt admitted to rm 18 from ED. CHG wipe give. Initiated tele. VSS. Call bell within reach.   Lawson Radar, RN

## 2023-07-07 NOTE — Progress Notes (Addendum)
Pharmacy Antibiotic Note  Joshua Mullins is a 47 y.o. male admitted on 07/07/2023 with concern for peritonitis.  Pharmacy has been consulted for vancomycin and Fortaz dosing.  Vancomycin 2g IV x 1 given in ED  Plan: Variable vancomycin dosing with PD Check vancomycin level in 2-3 days, re-dose when <20 mcg/mL Fortaz 1g IV q 24h Monitor PD and nephro plans, Cx and clinical progression to narrow  Height: 5\' 10"  (177.8 cm) Weight: 102.1 kg (225 lb) IBW/kg (Calculated) : 73  Temp (24hrs), Avg:99.8 F (37.7 C), Min:99.8 F (37.7 C), Max:99.8 F (37.7 C)  Recent Labs  Lab 07/07/23 0738 07/07/23 0745 07/07/23 1028  WBC 7.9  --   --   CREATININE 23.60*  --   --   LATICACIDVEN  --  1.1 0.7    Estimated Creatinine Clearance: 4.7 mL/min (A) (by C-G formula based on SCr of 23.6 mg/dL (H)).    Allergies  Allergen Reactions   Norvasc [Amlodipine] Swelling   Shellfish Allergy Swelling   Zestril [Lisinopril] Swelling    Daylene Posey, PharmD, [facility] Clinical Pharmacist ED Pharmacist Phone # [phone] 07/07/2023 11:34 AM

## 2023-07-07 NOTE — ED Triage Notes (Addendum)
PT arrives via POV from home with sudden severe generalized abdominal pain. Pt does home peritoneal dialysis, unable to complete full treatment this morning due to pain. Pt shaking, with multiple episodes of vomiting this morning. Pt is on waiting list for kidney transplant. Ill appearing.

## 2023-07-07 NOTE — Progress Notes (Signed)
ED Pharmacy Antibiotic Sign Off An antibiotic consult was received from an ED provider for vancomycin per pharmacy dosing for suspected peritonitis. A chart review was completed to assess appropriateness.   The following one time order(s) were placed:  Vancomycin 2gm IV x1.   Further antibiotic and/or antibiotic pharmacy consults should be ordered by the admitting provider if indicated.   Thank you for allowing pharmacy to be a part of this patient's care.   Link Snuffer, PharmD, BCPS, BCCCP Please refer to [facility] for [facility] Pharmacy numbers Clinical Pharmacist 07/07/23 9:07 AM

## 2023-07-07 NOTE — ED Notes (Signed)
ED TO INPATIENT HANDOFF REPORT  ED Nurse Name and Phone #: Grover Canavan [phone]  S Name/Age/Gender Joshua Mullins 47 y.o. male Room/Bed: 004C/004C  Code Status   Code Status: Full Code  Home/SNF/Other Home Patient oriented to: self, place, time, and situation Is this baseline? Yes   Triage Complete: Triage complete  Chief Complaint Peritonitis associated with peritoneal dialysis, initial encounter [facility]) [T85.71XA]  Triage Note PT arrives via POV from home with sudden severe generalized abdominal pain. Pt does home peritoneal dialysis, unable to complete full treatment this morning due to pain. Pt shaking, with multiple episodes of vomiting this morning. Pt is on waiting list for kidney transplant. Ill appearing.    Allergies Allergies  Allergen Reactions   Iodinated Contrast Media Other (See Comments) and Swelling    Other Reaction(s): Laryngeal Edema   Norvasc [Amlodipine] Swelling   Shellfish Allergy Swelling   Zestril [Lisinopril] Swelling    Level of Care/Admitting Diagnosis ED Disposition     ED Disposition  Admit   Condition  --   Comment  Hospital Area: [facility] [[id number]]  Level of Care: Progressive [102]  Admit to Progressive based on following criteria: MULTISYSTEM THREATS such as stable sepsis, metabolic/electrolyte imbalance with or without encephalopathy that is responding to early treatment.  Admit to Progressive based on following criteria: NEPHROLOGY stable condition requiring close monitoring for AKI, requiring Hemodialysis or Peritoneal Dialysis either from expected electrolyte imbalance, acidosis, or fluid overload that can be managed by NIPPV or high flow oxygen.  May place patient in observation at [facility] or [facility] if equivalent level of care is available:: No  Covid Evaluation: Asymptomatic - no recent exposure (last 10 days) testing not required  Diagnosis: Peritonitis associated with peritoneal dialysis, initial  encounter [facility]) [[id number]]  Admitting Physician: Miguel Aschoff [[id number]]  Attending Physician: Miguel Aschoff [[id number]]          B Medical/Surgery History Past Medical History:  Diagnosis Date   Diabetes mellitus without complication (HCC)    Type II   ESRD on peritoneal dialysis (HCC)    Hyperlipidemia    Hypertension    Sleep apnea    Uses a cpap   Past Surgical History:  Procedure Laterality Date   AV FISTULA PLACEMENT Left 07/21/2021   Procedure: LEFT ARM FISTULA CREATION;  Surgeon: Nada Libman, MD;  Location: [facility] OR;  Service: Vascular;  Laterality: Left;   AV FISTULA PLACEMENT Left 01/05/2022   Procedure: ARTERIOVENOUS (AV) FISTULA REVISION AND ELEVATION LEFT ARM;  Surgeon: Nada Libman, MD;  Location: [facility] OR;  Service: Vascular;  Laterality: Left;   CAPD INSERTION N/A 05/17/2022   Procedure: LAPAROSCOPIC INSERTION CONTINUOUS AMBULATORY PERITONEAL DIALYSIS  (CAPD) CATHETER;  Surgeon: Nada Libman, MD;  Location: [facility] OR;  Service: Vascular;  Laterality: N/A;     A IV Location/Drains/Wounds Patient Lines/Drains/Airways Status     Active Line/Drains/Airways     Name Placement date Placement time Site Days   Peripheral IV 07/07/23 20 G Right Antecubital 07/07/23  0835  Antecubital  less than 1   Fistula / Graft Left Forearm Arteriovenous fistula 07/21/21  0839  Forearm  716   Incision (Closed) 07/21/21 Arm Left 07/21/21  0820  -- 716   Incision (Closed) 01/05/22 Arm Left 01/05/22  0804  -- 548   Incision (Closed) 05/17/22 Abdomen 05/17/22  1123  -- 416   Incision - 2 Ports Abdomen 1: Mid 2: Mid;Upper 05/17/22  1110  -- 416  Incision - 3 Ports Abdomen 1: Left;Medial 2: Left;Medial;Lower 3: Umbilicus 05/17/22  1100  -- 416            Intake/Output Last 24 hours No intake or output data in the 24 hours ending 07/07/23 1655  Labs/Imaging Results for orders placed or performed during the hospital encounter of 07/07/23 (from the past 48 hour(s))   Urinalysis, w/ Reflex to Culture (Infection Suspected) -Urine, Clean Catch     Status: Abnormal   Collection Time: 07/07/23  7:34 AM  Result Value Ref Range   Specimen Source URINE, CLEAN CATCH    Color, Urine STRAW (A) YELLOW   APPearance CLEAR CLEAR   Specific Gravity, Urine 1.010 1.005 - 1.030   pH 6.0 5.0 - 8.0   Glucose, UA 50 (A) NEGATIVE mg/dL   Hgb urine dipstick SMALL (A) NEGATIVE   Bilirubin Urine NEGATIVE NEGATIVE   Ketones, ur NEGATIVE NEGATIVE mg/dL   Protein, ur 782 (A) NEGATIVE mg/dL   Nitrite NEGATIVE NEGATIVE   Leukocytes,Ua SMALL (A) NEGATIVE   RBC / HPF 6-10 0 - 5 RBC/hpf   WBC, UA 6-10 0 - 5 WBC/hpf    Comment:        Reflex urine culture not performed if WBC <=10, OR if Squamous epithelial cells >5. If Squamous epithelial cells >5 suggest recollection.    Bacteria, UA RARE (A) NONE SEEN   Squamous Epithelial / HPF 0-5 0 - 5 /HPF    Comment: Performed at [facility], [address], [place], [place] [address]  Comprehensive metabolic panel     Status: Abnormal   Collection Time: 07/07/23  7:38 AM  Result Value Ref Range   Sodium 138 135 - 145 mmol/L   Potassium 4.6 3.5 - 5.1 mmol/L   Chloride 99 98 - 111 mmol/L   CO2 19 (L) 22 - 32 mmol/L   Glucose, Bld 140 (H) 70 - 99 mg/dL    Comment: Glucose reference range applies only to samples taken after fasting for at least 8 hours.   BUN 99 (H) 6 - 20 mg/dL   Creatinine, Ser 30.86 (H) 0.61 - 1.24 mg/dL   Calcium 7.8 (L) 8.9 - 10.3 mg/dL   Total Protein 7.3 6.5 - 8.1 g/dL   Albumin 3.6 3.5 - 5.0 g/dL   AST 7 (L) 15 - 41 U/L   ALT 13 0 - 44 U/L   Alkaline Phosphatase 48 38 - 126 U/L   Total Bilirubin 0.7 <1.2 mg/dL   GFR, Estimated 2 (L) >60 mL/min    Comment: (NOTE) Calculated using the CKD-EPI Creatinine Equation (2021)    Anion gap 20 (H) 5 - 15    Comment: ELECTROLYTES REPEATED TO VERIFY Performed at [facility], [address] [place], [place] [address]   CBC with Differential      Status: Abnormal   Collection Time: 07/07/23  7:38 AM  Result Value Ref Range   WBC 7.9 4.0 - 10.5 K/uL   RBC 2.98 (L) 4.22 - 5.81 MIL/uL   Hemoglobin 9.4 (L) 13.0 - 17.0 g/dL   HCT 96.2 (L) 95.2 - 84.1 %   MCV 98.0 80.0 - 100.0 fL   MCH 31.5 26.0 - 34.0 pg   MCHC 32.2 30.0 - 36.0 g/dL   RDW 32.4 40.1 - 02.7 %   Platelets 220 150 - 400 K/uL   nRBC 0.0 0.0 - 0.2 %   Neutrophils Relative % 80 %   Neutro Abs 6.3  1.7 - 7.7 K/uL   Lymphocytes Relative 12 %   Lymphs Abs 1.0 0.7 - 4.0 K/uL   Monocytes Relative 7 %   Monocytes Absolute 0.5 0.1 - 1.0 K/uL   Eosinophils Relative 0 %   Eosinophils Absolute 0.0 0.0 - 0.5 K/uL   Basophils Relative 0 %   Basophils Absolute 0.0 0.0 - 0.1 K/uL   Immature Granulocytes 1 %   Abs Immature Granulocytes 0.06 0.00 - 0.07 K/uL    Comment: Performed at [facility], [address] [place], [place] [address]  Phosphorus     Status: Abnormal   Collection Time: 07/07/23  7:38 AM  Result Value Ref Range   Phosphorus 10.9 (H) 2.5 - 4.6 mg/dL    Comment: Performed at [facility], [address], [place], [place] [address]  I-Stat Lactic Acid, ED     Status: None   Collection Time: 07/07/23  7:45 AM  Result Value Ref Range   Lactic Acid, Venous 1.1 0.5 - 1.9 mmol/L  CBG monitoring, ED     Status: Abnormal   Collection Time: 07/07/23  8:53 AM  Result Value Ref Range   Glucose-Capillary 108 (H) 70 - 99 mg/dL    Comment: Glucose reference range applies only to samples taken after fasting for at least 8 hours.  I-Stat Lactic Acid, ED     Status: None   Collection Time: 07/07/23 10:28 AM  Result Value Ref Range   Lactic Acid, Venous 0.7 0.5 - 1.9 mmol/L   CT ABDOMEN PELVIS WO CONTRAST  Result Date: 07/07/2023 CLINICAL DATA:  47 year old male with history of acute onset of nonlocalized abdominal pain. EXAM: CT ABDOMEN AND PELVIS WITHOUT CONTRAST TECHNIQUE: Multidetector CT imaging of the abdomen and pelvis was performed following the  standard protocol without IV contrast. RADIATION DOSE REDUCTION: This exam was performed according to the departmental dose-optimization program which includes automated exposure control, adjustment of the mA and/or kV according to patient size and/or use of iterative reconstruction technique. COMPARISON:  No priors. FINDINGS: Lower chest: Unremarkable. Hepatobiliary: Centrally low-attenuation peripherally calcified lesion in segment 4A of the liver (axial image 15 of series 3) measuring 3.6 x 2.9 cm, incompletely characterized on today's noncontrast CT examination, but strongly favored to be benign (potentially related to remote trauma). No other definite suspicious hepatic lesions are noted. Unenhanced appearance of the gallbladder is unremarkable. Pancreas: No definite pancreatic mass or peripancreatic fluid collections or inflammatory changes are noted on today's noncontrast CT examination. Spleen: Unremarkable. Adrenals/Urinary Tract: Unenhanced appearance of the kidneys and bilateral adrenal glands is normal. No hydroureteronephrosis. Urinary bladder is unremarkable in appearance. Stomach/Bowel: Unenhanced appearance of the stomach is normal. No pathologic dilatation of small bowel or colon. Numerous colonic diverticuli are noted, without surrounding inflammatory changes to indicate an acute diverticulitis at this time. Normal caliber appendix containing some appendicoliths, without definite focal surrounding inflammatory changes. Vascular/Lymphatic: Atherosclerotic calcifications in the abdominal aorta and pelvic vasculature. No definite lymphadenopathy noted in the abdomen or pelvis. Reproductive: Prostate gland and seminal vesicles are unremarkable in appearance. Other: Tenckhoff catheter coiled in the low anatomic pelvis. Small volume of fluid in the peritoneal cavity, presumably peritoneal dialysate. Trace volume of pneumoperitoneum, presumably iatrogenic. Musculoskeletal: There are no aggressive  appearing lytic or blastic lesions noted in the visualized portions of the skeleton. IMPRESSION: 1. No definite acute findings confidently identified in the abdomen or pelvis to account for the patient's symptoms. Tenckhoff dialysis catheter appears appropriately located. Small volume of  dialysate and trace amount of pneumoperitoneum, presumably iatrogenic. 2. The appendix is normal in caliber and contains multiple appendicoliths. No definite focal surrounding inflammatory changes are confidently identified (assessment is slightly limited by presence of peritoneal dialysate). 3. Benign-appearing peripherally calcified lesion in segment 4A of the liver, incompletely characterized, but similar in size to prior abdominal MRI 04/30/2018, presumably benign (potentially related to remote trauma). 4. Aortic atherosclerosis. Electronically Signed   By: Trudie Reed M.D.   On: 07/07/2023 10:08   DG Chest 2 View  Result Date: 07/07/2023 CLINICAL DATA:  Sepsis EXAM: CHEST - 2 VIEW COMPARISON:  08/02/2006 FINDINGS: Normal heart size and mediastinal contours. No acute infiltrate or edema. No effusion or pneumothorax. No acute osseous findings. IMPRESSION: Negative chest. Electronically Signed   By: Tiburcio Pea M.D.   On: 07/07/2023 07:57    Pending Labs Unresulted Labs (From admission, onward)     Start     Ordered   07/09/23 1200  Vancomycin, random  Once,   R        07/07/23 1138   07/08/23 0500  CBC with Differential/Platelet  Tomorrow morning,   R        07/07/23 1034   07/08/23 0500  Renal function panel  Tomorrow morning,   R        07/07/23 1034   07/08/23 0500  Magnesium  Tomorrow morning,   R        07/07/23 1034   07/07/23 1112  Body fluid cell count with differential  Once,   R       Question:  Are there also cytology or pathology orders on this specimen?  Answer:  No   07/07/23 1112   07/07/23 1112  Body fluid culture w Gram Stain  Once,   R       Question Answer Comment  Are there  also cytology or pathology orders on this specimen? No   Patient immune status Immunocompromised      07/07/23 1112   07/07/23 1031  HIV Antibody (routine testing w rflx)  (HIV Antibody (Routine testing w reflex) panel)  Once,   R        07/07/23 1034   07/07/23 0759  Culture, blood (routine x 2)  BLOOD CULTURE X 2,   R (with STAT occurrences)      07/07/23 0758            Vitals/Pain Today's Vitals   07/07/23 1319 07/07/23 1400 07/07/23 1500 07/07/23 1556  BP:  130/80 115/66   Pulse:  95 95   Resp:  15 14   Temp:    99 F (37.2 C)  TempSrc:    Oral  SpO2:  93% 94%   Weight:      Height:      PainSc: 6        Isolation Precautions No active isolations  Medications Medications  heparin injection 5,000 Units (5,000 Units Subcutaneous Given 07/07/23 1323)  sodium chloride flush (NS) 0.9 % injection 3 mL (0 mLs Intravenous Hold 07/07/23 1045)  senna-docusate (Senokot-S) tablet 1 tablet (has no administration in time range)  acetaminophen (TYLENOL) tablet 1,000 mg (has no administration in time range)  gentamicin cream (GARAMYCIN) 0.1 % 1 Application (has no administration in time range)  vancomycin variable dose per unstable renal function (pharmacist dosing) (has no administration in time range)  cefTAZidime (FORTAZ) 1 g in sodium chloride 0.9 % 100 mL IVPB (has no administration in time range)  HYDROmorphone (DILAUDID)  tablet 1 mg (has no administration in time range)  calcitRIOL (ROCALTROL) capsule 1 mcg (has no administration in time range)  ferric citrate (AURYXIA) tablet 630 mg (has no administration in time range)  atorvastatin (LIPITOR) tablet 10 mg (has no administration in time range)  latanoprost (XALATAN) 0.005 % ophthalmic solution 1 drop (has no administration in time range)  heparin sodium (porcine) 3,000 Units in dialysis solution 2.5% low-MG/low-CA 6,000 mL dialysis solution (has no administration in time range)  cefTAZidime (FORTAZ) 1 g in sodium chloride  0.9 % 100 mL IVPB (0 g Intravenous Stopped 07/07/23 1018)  ondansetron (ZOFRAN) injection 4 mg (4 mg Intravenous Given 07/07/23 0835)  morphine (PF) 4 MG/ML injection 4 mg (4 mg Intravenous Given 07/07/23 0835)  vancomycin (VANCOREADY) IVPB 2000 mg/400 mL (0 mg Intravenous Stopped 07/07/23 1219)  HYDROmorphone (DILAUDID) injection 0.5 mg (0.5 mg Intravenous Given 07/07/23 1229)    Mobility walks with person assist     Focused Assessments Cardiac Assessment Handoff:  Cardiac Rhythm: Sinus tachycardia No results found for: "CKTOTAL", "CKMB", "CKMBINDEX", "TROPONINI" No results found for: "DDIMER" Does the Patient currently have chest pain? No    R Recommendations: See Admitting Provider Note  Report given to:   Additional Notes:

## 2023-07-07 NOTE — H&P (Addendum)
Date: 07/07/2023               Patient Name:  Joshua Mullins MRN: [id number]  DOB: 10-24-1975 Age / Sex: 47 y.o., male   PCP: Tracey Harries, MD         Medical Service: Internal Medicine Teaching Service         Attending Physician: Dr. Charissa Bash MD      First Contact: Dr. Kathleen Lime, MD Pager [phone]    Second Contact: Dr. Morene Crocker, MD Pager [phone]         After Hours (After 5p/  First Contact Pager: [phone]  weekends / holidays): Second Contact Pager: [phone]   SUBJECTIVE   Chief Complaint: " Abdominal Pain "   History of Present Illness:   Mr Reischman is a 47 year old male with a history of ESRD on PD , HTN,HLD and Diabetes who presented to the ED due to 4 days of progressive abdominal pain, nauseas and vomiting in the setting of a cloudy dialysate. Patient is here with his wife who also gave part of this history.Patient is been having vague abdominal pain that has gotten worse in the past few days but unbearable today.Patient started peritoneal dialysis on October 2023  and usually gets about 5 L fluid removal with 5  sessions daily.Last night when he was dialyzing on the second session, his abdominal pain got worse that he had to take himself off the machine but the pain persisted.   He reports he is been dialyzing well until a week ago when he had COVID and started having abdominal discomfort that they thought was due to the COVID. He also reported loose non-bloody stools and brown vomit of undigested food and this has made it hard for him to keep anything down for the past 2 days.The nausea and vomiting has somewhat improved but his persistent abdominal discomfort made them come to the ED for further evaluation.He reported ans associated  intermittent chills but no fever or rigors during this time.   He has a tunnel cath on his left arm in 2023 but has never been used.  Review of Systems: A complete ROS was negative except as per HPI.   In the ED,  his abdominal pain was about 12/10 so he received one dose of 4 mg Morphine. Dr Arlean Hopping was paged for concerns for peritonitis and IMTS was consulted for admission.   Past Medical History ESRD on PD HTN HLD Diabetes  Meds:  Lipitor 10 mg daily Hydralazine 50 mg BID Toprol 100 mg daily Rexia 3 pills before eating Calclium acetate Latanoprost both eyes   Allergies: Allergies as of 07/07/2023 - Review Complete 07/07/2023  Allergen Reaction Noted   Iodinated contrast media Other (See Comments) and Swelling 12/26/2022   Norvasc [amlodipine] Swelling 01/04/2021   Shellfish allergy Swelling 10/08/2019   Zestril [lisinopril] Swelling 01/04/2021    Past Surgical History:  Procedure Laterality Date   AV FISTULA PLACEMENT Left 07/21/2021   Procedure: LEFT ARM FISTULA CREATION;  Surgeon: Nada Libman, MD;  Location: [facility] OR;  Service: Vascular;  Laterality: Left;   AV FISTULA PLACEMENT Left 01/05/2022   Procedure: ARTERIOVENOUS (AV) FISTULA REVISION AND ELEVATION LEFT ARM;  Surgeon: Nada Libman, MD;  Location: [facility] OR;  Service: Vascular;  Laterality: Left;   CAPD INSERTION N/A 05/17/2022   Procedure: LAPAROSCOPIC INSERTION CONTINUOUS AMBULATORY PERITONEAL DIALYSIS  (CAPD) CATHETER;  Surgeon: Nada Libman, MD;  Location: [facility] OR;  Service: Vascular;  Laterality: N/A;   Family History:  Diabetes in Mother   Social:  Lives With: Wife and Children Occupation: Unemployed  Support: Self Level of Function: Independent on all ADLs/iADLS PCP: Tracey Harries, MD Substances: Decline the use of tobacco, alcohol or any illicit drugs  Production manager: Wife  Code Status: FULL  OBJECTIVE:   Physical Exam: Blood pressure (!) 181/114, pulse (!) 106, temperature 99.8 F (37.7 C), temperature source Oral, resp. rate (!) 22, height 5\' 10"  (1.778 m), weight 102.1 kg, SpO2 99%.   Constitutional: Well-appearing man, sitting in bed, in no acute distress,answering question appropriately   HENT: normocephalic atraumatic, mucous membranes moist Eyes: conjunctiva non-erythematous Cardiovascular: regular rate and rhythm, no m/r/g Pulmonary/Chest: normal work of breathing on room air, lungs clear to auscultation bilaterally Abdominal: Mildly tender on palpation at RLQ and LLQ, No guarding or rebound tenderness noted . PD site looked clean , dry and intact and well dressed. MSK: normal bulk and tone. Tunnel Cath present on left arm with palpable thrills  Neurological: alert & oriented x 3, 5/5 strength in bilateral upper and lower extremities, normal gait Skin: warm and dry Psych: normal mood and affect  Labs: CBC    Latest Ref Rng & Units 07/07/2023    7:38 AM 05/17/2022    7:59 AM 01/05/2022    6:15 AM  CBC  WBC 4.0 - 10.5 K/uL 7.9     Hemoglobin 13.0 - 17.0 g/dL 9.4  75.6  43.3   Hematocrit 39.0 - 52.0 % 29.2  32.0  33.0   Platelets 150 - 400 K/uL 220       CMP     Latest Ref Rng & Units 07/07/2023    7:38 AM 05/17/2022    7:59 AM 01/05/2022    6:15 AM  CMP  Glucose 70 - 99 mg/dL 295  63  81   BUN 6 - 20 mg/dL 99  99  188   Creatinine 0.61 - 1.24 mg/dL 41.66  06.30  16.01   Sodium 135 - 145 mmol/L 138  140  141   Potassium 3.5 - 5.1 mmol/L 4.6  4.9  5.0   Chloride 98 - 111 mmol/L 99  108  110   CO2 22 - 32 mmol/L 19     Calcium 8.9 - 10.3 mg/dL 7.8     Total Protein 6.5 - 8.1 g/dL 7.3     Total Bilirubin <1.2 mg/dL 0.7     Alkaline Phos 38 - 126 U/L 48     AST 15 - 41 U/L 7     ALT 0 - 44 U/L 13        Imaging: CT ABDOMEN PELVIS WO CONTRAST  Result Date: 07/07/2023 CLINICAL DATA:  47 year old male with history of acute onset of nonlocalized abdominal pain. EXAM: CT ABDOMEN AND PELVIS WITHOUT CONTRAST TECHNIQUE: Multidetector CT imaging of the abdomen and pelvis was performed following the standard protocol without IV contrast. RADIATION DOSE REDUCTION: This exam was performed according to the departmental dose-optimization program which includes automated  exposure control, adjustment of the mA and/or kV according to patient size and/or use of iterative reconstruction technique. COMPARISON:  No priors. FINDINGS: Lower chest: Unremarkable. Hepatobiliary: Centrally low-attenuation peripherally calcified lesion in segment 4A of the liver (axial image 15 of series 3) measuring 3.6 x 2.9 cm, incompletely characterized on today's noncontrast CT examination, but strongly favored to be benign (potentially related to remote trauma). No other definite suspicious hepatic lesions are noted. Unenhanced appearance  of the gallbladder is unremarkable. Pancreas: No definite pancreatic mass or peripancreatic fluid collections or inflammatory changes are noted on today's noncontrast CT examination. Spleen: Unremarkable. Adrenals/Urinary Tract: Unenhanced appearance of the kidneys and bilateral adrenal glands is normal. No hydroureteronephrosis. Urinary bladder is unremarkable in appearance. Stomach/Bowel: Unenhanced appearance of the stomach is normal. No pathologic dilatation of small bowel or colon. Numerous colonic diverticuli are noted, without surrounding inflammatory changes to indicate an acute diverticulitis at this time. Normal caliber appendix containing some appendicoliths, without definite focal surrounding inflammatory changes. Vascular/Lymphatic: Atherosclerotic calcifications in the abdominal aorta and pelvic vasculature. No definite lymphadenopathy noted in the abdomen or pelvis. Reproductive: Prostate gland and seminal vesicles are unremarkable in appearance. Other: Tenckhoff catheter coiled in the low anatomic pelvis. Small volume of fluid in the peritoneal cavity, presumably peritoneal dialysate. Trace volume of pneumoperitoneum, presumably iatrogenic. Musculoskeletal: There are no aggressive appearing lytic or blastic lesions noted in the visualized portions of the skeleton. IMPRESSION: 1. No definite acute findings confidently identified in the abdomen or pelvis to  account for the patient's symptoms. Tenckhoff dialysis catheter appears appropriately located. Small volume of dialysate and trace amount of pneumoperitoneum, presumably iatrogenic. 2. The appendix is normal in caliber and contains multiple appendicoliths. No definite focal surrounding inflammatory changes are confidently identified (assessment is slightly limited by presence of peritoneal dialysate). 3. Benign-appearing peripherally calcified lesion in segment 4A of the liver, incompletely characterized, but similar in size to prior abdominal MRI 04/30/2018, presumably benign (potentially related to remote trauma). 4. Aortic atherosclerosis. Electronically Signed   By: Trudie Reed M.D.   On: 07/07/2023 10:08   DG Chest 2 View  Result Date: 07/07/2023 CLINICAL DATA:  Sepsis EXAM: CHEST - 2 VIEW COMPARISON:  08/02/2006 FINDINGS: Normal heart size and mediastinal contours. No acute infiltrate or edema. No effusion or pneumothorax. No acute osseous findings. IMPRESSION: Negative chest. Electronically Signed   By: Tiburcio Pea M.D.   On: 07/07/2023 07:57    EKG: personally reviewed my interpretation is sinus tachycardia. Prior EKG unchanged  ASSESSMENT & PLAN:   Assessment & Plan by Problem: Principal Problem:   Peritonitis associated with peritoneal dialysis, initial encounter [facility])   Joshua Mullins is a 47 y.o. person living with a history of ESRD , HTN,HLD and Diabetes  who presented with abdominal pain and nausea/Vomiting  and admitted for peritonitis associated with peritoneal dialysis .  #Abdominal pain #Concerns for peritonitis associated with peritoneal dialysis This is a ESRD patient on PD who has been unwell for the past few days, thought to be from his recent COVID infection but all other Covid symptoms has since been resolved except this abdominal pain that seems to be progressively getting worse also found to have some cloudy dialysate concerning for abdominal infection.  Differentials for acute abdominal pain in this patient include peritonitis, pyelonephritis,infectious colitis,appendicitis and diverticulitis. Patient denies any urinary symptoms, is afebrile and has a normal white count. His chest XR didn't show any signs of intrapulmonary infections at this time.Low suspicions for pyelonephritis or other infectious etiology even though peritonitis cannot be excluded completely. CT abdomen showed no definite acute findings confidently identified in the abdomen,Tenckhoff dialysis catheter appears appropriately located.It also showed that the  appendix is normal in caliber and no definite focal surrounding inflammatory changes.This patient's abdominal pain and new onset cloudy fluid w/ PD run last night makes me highly suspect  PD cath-related peritonitis even though exam was negative for guarding or rebound tenderness. Started on Vancomycin and  Ceftazidime ,nephrology is following. - Nephrology recs Appreciated -  F/U on Fluid Specimen - Cell count and culture  - Continue Vancomycin and Ceftazidime - Optimize pain control - Dilaudid ORN - Zofran for nausea   #ESRD #Volume Overloaded Hx of ESRD on home PD since Oct 2023, Follows up with Dr Ronalee Belts of  [facility]. Nephrology is following. Volume overloaded likely in the setting of interrupted  due to abdominal pain  - CCPD tonight per Nephrology  - Daily weight  #HTN BP elevated at 155/94 on admission. Currently at 134/91. Will continue to monitor and resume home antihypertensive when appropriate.  #Liver Lesion  CT showed Benign-appearing peripherally calcified lesion in segment 4A of the liver, incompletely characterized, but similar in size to prior abdominal MRI 04/30/2018, presumably benign (potentially related to remote trauma). - Monitor   #Slightly prolonged Qtc EKG showed a prolonged Qtc of 472. WCTM  #HLD - Continue home Lipitor when able   #Anemia - Hgb of 9.4. No signs of  bleeding. Trend CBC   Diet: Renal VTE: Heparin IVF: None,None Code: Full  Prior to Admission Living Arrangement: Home, living with Family Anticipated Discharge Location: Home Barriers to Discharge: Medical work up and treatment   Dispo: Admit patient to Inpatient with expected length of stay greater than 2 midnights.  Signed: Kathleen Lime, MD Internal Medicine Resident PGY-1  07/07/2023, 12:50 PM

## 2023-07-07 NOTE — Progress Notes (Signed)
On call MD notified of patient vomiting.

## 2023-07-08 DIAGNOSIS — Z8616 Personal history of COVID-19: Secondary | ICD-10-CM | POA: Diagnosis not present

## 2023-07-08 DIAGNOSIS — Z79899 Other long term (current) drug therapy: Secondary | ICD-10-CM | POA: Diagnosis not present

## 2023-07-08 DIAGNOSIS — T8029XA Infection following other infusion, transfusion and therapeutic injection, initial encounter: Secondary | ICD-10-CM | POA: Diagnosis present

## 2023-07-08 DIAGNOSIS — K658 Other peritonitis: Secondary | ICD-10-CM | POA: Diagnosis present

## 2023-07-08 DIAGNOSIS — R188 Other ascites: Secondary | ICD-10-CM | POA: Diagnosis present

## 2023-07-08 DIAGNOSIS — N250 Renal osteodystrophy: Secondary | ICD-10-CM | POA: Diagnosis present

## 2023-07-08 DIAGNOSIS — Z992 Dependence on renal dialysis: Secondary | ICD-10-CM | POA: Diagnosis not present

## 2023-07-08 DIAGNOSIS — Z560 Unemployment, unspecified: Secondary | ICD-10-CM | POA: Diagnosis not present

## 2023-07-08 DIAGNOSIS — M109 Gout, unspecified: Secondary | ICD-10-CM | POA: Diagnosis present

## 2023-07-08 DIAGNOSIS — Z888 Allergy status to other drugs, medicaments and biological substances status: Secondary | ICD-10-CM | POA: Diagnosis not present

## 2023-07-08 DIAGNOSIS — T8571XA Infection and inflammatory reaction due to peritoneal dialysis catheter, initial encounter: Secondary | ICD-10-CM | POA: Diagnosis not present

## 2023-07-08 DIAGNOSIS — Y841 Kidney dialysis as the cause of abnormal reaction of the patient, or of later complication, without mention of misadventure at the time of the procedure: Secondary | ICD-10-CM | POA: Diagnosis present

## 2023-07-08 DIAGNOSIS — K769 Liver disease, unspecified: Secondary | ICD-10-CM | POA: Diagnosis present

## 2023-07-08 DIAGNOSIS — I120 Hypertensive chronic kidney disease with stage 5 chronic kidney disease or end stage renal disease: Secondary | ICD-10-CM | POA: Diagnosis present

## 2023-07-08 DIAGNOSIS — Z7984 Long term (current) use of oral hypoglycemic drugs: Secondary | ICD-10-CM | POA: Diagnosis not present

## 2023-07-08 DIAGNOSIS — D631 Anemia in chronic kidney disease: Secondary | ICD-10-CM | POA: Diagnosis present

## 2023-07-08 DIAGNOSIS — Z833 Family history of diabetes mellitus: Secondary | ICD-10-CM | POA: Diagnosis not present

## 2023-07-08 DIAGNOSIS — E66811 Obesity, class 1: Secondary | ICD-10-CM | POA: Diagnosis present

## 2023-07-08 DIAGNOSIS — E785 Hyperlipidemia, unspecified: Secondary | ICD-10-CM | POA: Diagnosis present

## 2023-07-08 DIAGNOSIS — E1122 Type 2 diabetes mellitus with diabetic chronic kidney disease: Secondary | ICD-10-CM | POA: Diagnosis present

## 2023-07-08 DIAGNOSIS — Z6833 Body mass index (BMI) 33.0-33.9, adult: Secondary | ICD-10-CM | POA: Diagnosis not present

## 2023-07-08 DIAGNOSIS — E8770 Fluid overload, unspecified: Secondary | ICD-10-CM | POA: Diagnosis present

## 2023-07-08 DIAGNOSIS — Z91013 Allergy to seafood: Secondary | ICD-10-CM | POA: Diagnosis not present

## 2023-07-08 DIAGNOSIS — Z91041 Radiographic dye allergy status: Secondary | ICD-10-CM | POA: Diagnosis not present

## 2023-07-08 DIAGNOSIS — N186 End stage renal disease: Secondary | ICD-10-CM | POA: Diagnosis present

## 2023-07-08 DIAGNOSIS — Z7985 Long-term (current) use of injectable non-insulin antidiabetic drugs: Secondary | ICD-10-CM | POA: Diagnosis not present

## 2023-07-08 LAB — BODY FLUID CELL COUNT WITH DIFFERENTIAL
Eos, Fluid: 1 %
Eos, Fluid: 0 %
Lymphs, Fluid: 3 %
Lymphs, Fluid: 9 %
Monocyte-Macrophage-Serous Fluid: 13 % — ABNORMAL LOW (ref 50–90)
Monocyte-Macrophage-Serous Fluid: 21 % — ABNORMAL LOW (ref 50–90)
Neutrophil Count, Fluid: 83 % — ABNORMAL HIGH (ref 0–25)
Neutrophil Count, Fluid: 70 % — ABNORMAL HIGH (ref 0–25)
Total Nucleated Cell Count, Fluid: 22750 uL — ABNORMAL HIGH (ref 0–1000)
Total Nucleated Cell Count, Fluid: 337 uL (ref 0–1000)

## 2023-07-08 LAB — GLUCOSE, CAPILLARY: Glucose-Capillary: 132 mg/dL — ABNORMAL HIGH (ref 70–99)

## 2023-07-08 LAB — RENAL FUNCTION PANEL
Albumin: 2.9 g/dL — ABNORMAL LOW (ref 3.5–5.0)
Anion gap: 18 — ABNORMAL HIGH (ref 5–15)
BUN: 95 mg/dL — ABNORMAL HIGH (ref 6–20)
CO2: 19 mmol/L — ABNORMAL LOW (ref 22–32)
Calcium: 7 mg/dL — ABNORMAL LOW (ref 8.9–10.3)
Chloride: 97 mmol/L — ABNORMAL LOW (ref 98–111)
Creatinine, Ser: 22.44 mg/dL — ABNORMAL HIGH (ref 0.61–1.24)
GFR, Estimated: 2 mL/min — ABNORMAL LOW (ref >60)
Glucose, Bld: 181 mg/dL — ABNORMAL HIGH (ref 70–99)
Phosphorus: 8.8 mg/dL — ABNORMAL HIGH (ref 2.5–4.6)
Potassium: 4.5 mmol/L (ref 3.5–5.1)
Sodium: 134 mmol/L — ABNORMAL LOW (ref 135–145)

## 2023-07-08 LAB — CBC WITH DIFFERENTIAL/PLATELET
Abs Immature Granulocytes: 0.06 10*3/uL (ref 0.00–0.07)
Basophils Absolute: 0 10*3/uL (ref 0.0–0.1)
Basophils Relative: 0 %
Eosinophils Absolute: 0 10*3/uL (ref 0.0–0.5)
Eosinophils Relative: 0 %
HCT: 24.4 % — ABNORMAL LOW (ref 39.0–52.0)
Hemoglobin: 7.9 g/dL — ABNORMAL LOW (ref 13.0–17.0)
Immature Granulocytes: 1 %
Lymphocytes Relative: 12 %
Lymphs Abs: 1 10*3/uL (ref 0.7–4.0)
MCH: 31.2 pg (ref 26.0–34.0)
MCHC: 32.4 g/dL (ref 30.0–36.0)
MCV: 96.4 fL (ref 80.0–100.0)
Monocytes Absolute: 0.6 10*3/uL (ref 0.1–1.0)
Monocytes Relative: 7 %
Neutro Abs: 7 10*3/uL (ref 1.7–7.7)
Neutrophils Relative %: 80 %
Platelets: 195 10*3/uL (ref 150–400)
RBC: 2.53 MIL/uL — ABNORMAL LOW (ref 4.22–5.81)
RDW: 13.6 % (ref 11.5–15.5)
WBC: 8.7 10*3/uL (ref 4.0–10.5)
nRBC: 0 % (ref 0.0–0.2)

## 2023-07-08 LAB — MAGNESIUM: Magnesium: 2 mg/dL (ref 1.7–2.4)

## 2023-07-08 LAB — HIV ANTIBODY (ROUTINE TESTING W REFLEX): HIV Screen 4th Generation wRfx: NONREACTIVE

## 2023-07-08 MED ORDER — TRIMETHOBENZAMIDE HCL 100 MG/ML IM SOLN
200.0000 mg | Freq: Four times a day (QID) | INTRAMUSCULAR | Status: DC | PRN
  Administered 2023-07-08 – 2023-07-09 (×3): 200 mg via INTRAMUSCULAR
  Filled 2023-07-08 (×4): qty 2

## 2023-07-08 MED ORDER — HYDROMORPHONE HCL 1 MG/ML IJ SOLN
1.0000 mg | INTRAMUSCULAR | Status: DC
  Administered 2023-07-08 (×2): 1 mg via INTRAVENOUS
  Filled 2023-07-08 (×3): qty 1

## 2023-07-08 MED ORDER — SCOPOLAMINE 1 MG/3DAYS TD PT72
1.0000 | MEDICATED_PATCH | TRANSDERMAL | Status: DC
  Administered 2023-07-08: 1.5 mg via TRANSDERMAL
  Filled 2023-07-08: qty 1

## 2023-07-08 MED ORDER — HYDROMORPHONE HCL 2 MG PO TABS
1.0000 mg | ORAL_TABLET | Freq: Four times a day (QID) | ORAL | Status: DC
  Administered 2023-07-08: 1 mg via ORAL
  Filled 2023-07-08: qty 1

## 2023-07-08 MED ORDER — DEXTROSE 5 % IV SOLN
0.5000 g | INTRAVENOUS | Status: DC
  Administered 2023-07-09 – 2023-07-10 (×2): 0.5 g via INTRAVENOUS
  Filled 2023-07-08 (×2): qty 0.5

## 2023-07-08 MED ORDER — ONDANSETRON HCL 4 MG/2ML IJ SOLN
4.0000 mg | Freq: Once | INTRAMUSCULAR | Status: AC
  Administered 2023-07-08: 4 mg via INTRAVENOUS
  Filled 2023-07-08: qty 2

## 2023-07-08 MED ORDER — DARBEPOETIN ALFA 60 MCG/0.3ML IJ SOSY
60.0000 ug | PREFILLED_SYRINGE | Freq: Once | INTRAMUSCULAR | Status: AC
  Administered 2023-07-08: 60 ug via SUBCUTANEOUS
  Filled 2023-07-08: qty 0.3

## 2023-07-08 MED ORDER — ACETAMINOPHEN 500 MG PO TABS
1000.0000 mg | ORAL_TABLET | Freq: Four times a day (QID) | ORAL | Status: DC
  Administered 2023-07-08 – 2023-07-10 (×6): 1000 mg via ORAL
  Filled 2023-07-08 (×6): qty 2

## 2023-07-08 NOTE — Progress Notes (Signed)
PD tx initation note:   Pre TX VS:   Pre TX weight: 112 kg  PD treatment initiated via aseptic technique. Consent signed and in chart. Patient is alert and oriented. No complaints of pain. specimen collected. PD exit site clean, dry and intact. Gentamycin and new dressing applied. Bedside RN educated on PD machine and how to contact tech support when PD machine alarms.    07/08/23 1943  Peritoneal Catheter   Placement Date/Time: 05/17/22 1135   Serial / Lot #: [id number]  Expiration Date: 01/17/26  Procedural Verification: Medical records & consent reviewed;Relevant studies,results and images reviewed;Site marked with initials  Time out: Correct Patient;Correc...  Site Assessment Clean, Dry, Intact  Drainage Description None  Catheter status Deaccessed  Dressing Gauze/Drain sponge  Dressing Status Clean, Dry, Intact  Dressing Intervention Other (Comment)  Cycler Setup  Total Number of Night Cycles 5  Night Fill Volume 250  Dianeal Solution Dextrose 2.5% in 6000 mL Low Cal/Low Mag  Dianeal Additive Heparin  Night Dwell Time per Cycle - Hour(s) 1  Night Dwell Time per Cycle - Minute(s) 30  Night Time Therapy - Minute(s) 28  Night Time Therapy - Hour(s) 10  Minimum Initial Drain Volume 0  Maximum Peritoneal Volume 37  Night/Total Therapy Volume 60630  Day Exchange No  Hand-off documentation  Hand-off Given Given to shift RN/LPN  Report given to (Full Name) Landree Fernholz, RN  Hand-off Received Received from shift RN/LPN  Report received from (Full Name) Tracie Harrier, RN

## 2023-07-08 NOTE — Progress Notes (Signed)
OT Cancellation Note  Patient Details Name: Joshua Mullins MRN: [id number] DOB: [birth year]   Cancelled Treatment:    Reason Eval/Treat Not Completed: Patient at procedure or test/ unavailable Pt on PD currently; reports it will be completed around 1445. If OT unable to check back this PM for eval, will follow up tomorrow.  Lorre Munroe 07/08/2023, 12:59 PM

## 2023-07-08 NOTE — Progress Notes (Signed)
Patient experiencing nausea with vomiting x2. Notified MD.  Kenard Gower, RN

## 2023-07-08 NOTE — Progress Notes (Signed)
   07/08/23 0156  Peritoneal Catheter   Placement Date/Time: 05/17/22 1135   Serial / Lot #: [id number]  Expiration Date: 01/17/26  Procedural Verification: Medical records & consent reviewed;Relevant studies,results and images reviewed;Site marked with initials  Time out: Correct Patient;Correc...  Site Assessment Clean, Dry, Intact  Drainage Description None  Catheter status Accessed  Dressing Gauze/Drain sponge  Dressing Status Clean, Dry, Intact  Dressing Intervention New dressing  Completion  Effluent Appearance Cloudy  Cell Count on Daytime Exchange Sent  Hand-off documentation  Hand-off Given Given to shift RN/LPN  Report given to (Full Name) Tracie Harrier, RN  Hand-off Received Received from shift RN/LPN  Report received from (Full Name) Miner Koral, RN

## 2023-07-08 NOTE — Plan of Care (Signed)

## 2023-07-08 NOTE — Evaluation (Signed)
Physical Therapy Evaluation Patient Details Name: Joshua Mullins MRN: [id number] DOB: 1975-11-18 Today's Date: 07/08/2023  History of Present Illness  The patient is a 47 y.o. year-old admitted 11/16 with c/o sudden onset gen'd abd pain during his PD. He has been having milder abd pains for a few days prior, but this was unbearable. Vomited several times. PMH: Has ESRD and is on PD nightly since 2023,HTN,HLD and Diabetes  Clinical Impression  Pt admitted with above diagnosis. Pt limited due to still connected to PD bedside. Pt agreed to sit up and was able to come to eOB with assist. Nauseated once at EOB and vomited. Called nurse for nausea meds. Pt needed mod assist to stand up and take steps to [facility] prior to lying back down.  Will follow acutely and progress pt as able. Pt currently with functional limitations due to the deficits listed below (see PT Problem List). Pt will benefit from acute skilled PT to increase their independence and safety with mobility to allow discharge.           If plan is discharge home, recommend the following: A little help with walking and/or transfers;A little help with bathing/dressing/bathroom;Assistance with cooking/housework;Assist for transportation;Help with stairs or ramp for entrance   Can travel by private vehicle        Equipment Recommendations Rolling walker (2 wheels);BSC/3in1 (May need RW and 3N1 depending on progress)  Recommendations for Other Services       Functional Status Assessment Patient has had a recent decline in their functional status and demonstrates the ability to make significant improvements in function in a reasonable and predictable amount of time.     Precautions / Restrictions Precautions Precautions: Fall Restrictions Weight Bearing Restrictions: No      Mobility  Bed Mobility Overal bed mobility: Needs Assistance Bed Mobility: Supine to Sit     Supine to sit: Supervision     General bed mobility  comments: incr time and effort by pt. Once sitting up, c/o nausea and vomited. Called nurse for meds.    Transfers Overall transfer level: Needs assistance Equipment used: 2 person hand held assist Transfers: Sit to/from Stand Sit to Stand: From elevated surface, Mod assist           General transfer comment: Pt had difficulty standing from low bed and needed elevated surface and assist. LEs shaky and had to assist pt with stability.  Pt needs min assist once standing for balance.  Pt surprised that he had difficulty with mobility.    Ambulation/Gait                  Stairs            Wheelchair Mobility     Tilt Bed    Modified Rankin (Stroke Patients Only)       Balance                                             Pertinent Vitals/Pain Pain Assessment Pain Assessment: Faces Faces Pain Scale: Hurts little more Pain Location: abdomen Pain Descriptors / Indicators: Aching, Discomfort Pain Intervention(s): Limited activity within patient's tolerance, Monitored during session, Premedicated before session    Home Living Family/patient expects to be discharged to:: Private residence Living Arrangements: Spouse/significant other Available Help at Discharge: Family;Available PRN/intermittently (wife works full time sometimes 12 hours) Type of Home:  House Home Access: Stairs to enter Entrance Stairs-Rails: None Secretary/administrator of Steps: 3   Home Layout: One level Home Equipment: None      Prior Function Prior Level of Function : Independent/Modified Independent;Driving                     Extremity/Trunk Assessment   Upper Extremity Assessment Upper Extremity Assessment: Defer to OT evaluation    Lower Extremity Assessment Lower Extremity Assessment: Generalized weakness    Cervical / Trunk Assessment Cervical / Trunk Assessment: Normal  Communication   Communication Communication: No apparent difficulties   Cognition Arousal: Alert Behavior During Therapy: WFL for tasks assessed/performed Overall Cognitive Status: Within Functional Limits for tasks assessed                                          General Comments General comments (skin integrity, edema, etc.): VSS 91 bpm, 96%RA, 162/87 post eval    Exercises General Exercises - Lower Extremity Ankle Circles/Pumps: AROM, Both, 10 reps, Supine Long Arc Quad: AROM, Both, 10 reps, Seated   Assessment/Plan    PT Assessment Patient needs continued PT services  PT Problem List Decreased activity tolerance;Decreased balance;Decreased mobility;Decreased knowledge of use of DME;Decreased safety awareness;Decreased knowledge of precautions       PT Treatment Interventions DME instruction;Gait training;Functional mobility training;Therapeutic activities;Therapeutic exercise;Balance training;Patient/family education    PT Goals (Current goals can be found in the Care Plan section)  Acute Rehab PT Goals Patient Stated Goal: to get better PT Goal Formulation: With patient Time For Goal Achievement: 07/22/23 Potential to Achieve Goals: Good    Frequency Min 1X/week     Co-evaluation               AM-PAC PT "6 Clicks" Mobility  Outcome Measure Help needed turning from your back to your side while in a flat bed without using bedrails?: A Little Help needed moving from lying on your back to sitting on the side of a flat bed without using bedrails?: A Little Help needed moving to and from a bed to a chair (including a wheelchair)?: A Lot Help needed standing up from a chair using your arms (e.g., wheelchair or bedside chair)?: A Lot Help needed to walk in hospital room?: A Lot Help needed climbing 3-5 steps with a railing? : A Lot 6 Click Score: 14    End of Session   Activity Tolerance: Patient limited by fatigue Patient left: in bed;with call bell/phone within reach;with bed alarm set;with family/visitor  present Nurse Communication: Mobility status PT Visit Diagnosis: Muscle weakness (generalized) (M62.81)    Time: 9604-5409 PT Time Calculation (min) (ACUTE ONLY): 32 min   Charges:   PT Evaluation $PT Eval Moderate Complexity: 1 Mod PT Treatments $Therapeutic Activity: 8-22 mins PT General Charges $$ ACUTE PT VISIT: 1 Visit         Joshua Mullins,PT Acute Rehab Services [phone]   Bevelyn Buckles 07/08/2023, 3:53 PM

## 2023-07-08 NOTE — Progress Notes (Signed)
PD post treatment note  PD treatment completed. Patient tolerated treatment well. PD effluent is clear. No specimen collected.  PD exit site clean, dry and intact. Patient is awake, oriented and in no acute distress.  Report given to bedside nurse.   Post treatment VS:   Total UF removed:  189 ml  Post treatment weight: 108kg    07/08/23 1604  Peritoneal Catheter   Placement Date/Time: 05/17/22 1135   Serial / Lot #: [id number]  Expiration Date: 01/17/26  Procedural Verification: Medical records & consent reviewed;Relevant studies,results and images reviewed;Site marked with initials  Time out: Correct Patient;Correc...  Site Assessment Clean, Dry, Intact  Drainage Description None  Catheter status Deaccessed  Dressing Gauze/Drain sponge  Dressing Status Clean, Dry, Intact  Dressing Intervention Dressing reinforced  Completion  Treatment Status Complete  Initial Drain Volume 299  Average Dwell Time-Hour(s) 1  Average Dwell Time-Min(s) 30  Average Drain Time 40  Total Therapy Volume 60454  Total Therapy Time-Hour(s) 12  Total Therapy Time-Min(s) 8  Effluent Appearance Amber;Yellow  Cell Count on Daytime Exchange Sent  Fluid Balance - CCPD  Total UF (+ value on cycler, pt loss) 189 mL  Procedure Comments  Tolerated treatment well? Yes  Peritoneal Dialysis Comments tx completed, tolerated well, no complaints.  pt is stable.  Education / Care Plan  Dialysis Education Provided Yes  Hand-off documentation  Hand-off Given Given to shift RN/LPN  Report given to (Full Name) Toni Amend, RN  Hand-off Received Received from shift RN/LPN  Report received from (Full Name) Princess Bruins, RN

## 2023-07-08 NOTE — Progress Notes (Signed)
HD#0 SUBJECTIVE:  Patient Summary: Joshua Mullins is a 47 y.o. with a pertinent PMH of ESRD on PD , HTN,HLD and Diabetes who presented to the ED due to 4 days of progressive abdominal pain, nauseas and vomiting in the setting of a cloudy dialysate admitted for peritonitis work up.  Overnight Events:  Had CCPD  Interim History:  Patient seen at bedside, laying in bed ,and having PD. Denies any shortness of breath or chest pain. Did report that his abdominal pain is still about 6/10 from 12/10 when he first came in.Patient expressed his proufound gratitude for the care he is gotten here so far.  OBJECTIVE:  Vital Signs: Vitals:   07/07/23 2020 07/07/23 2326 07/08/23 0608 07/08/23 0839  BP: (!) 141/81 120/74  118/66  Pulse: 85 88 88 81  Resp: 18 12 13 10   Temp:  98.2 F (36.8 C)  97.8 F (36.6 C)  TempSrc:  Oral  Axillary  SpO2: 97% 93% 97% 97%  Weight:   102.2 kg   Height:       Supplemental O2: Room Air SpO2: 97 %  Filed Weights   07/07/23 0732 07/08/23 0608  Weight: 102.1 kg 102.2 kg    No intake or output data in the 24 hours ending 07/08/23 1113 Net IO Since Admission: No IO data has been entered for this period [07/08/23 1113]  Physical Exam: Physical Exam Constitutional:      General: He is not in acute distress.    Appearance: He is not ill-appearing, toxic-appearing or diaphoretic.  Cardiovascular:     Rate and Rhythm: Normal rate and regular rhythm.     Heart sounds: Normal heart sounds.  Pulmonary:     Effort: Pulmonary effort is normal. No respiratory distress.     Breath sounds: Normal breath sounds.  Abdominal:     General: Abdomen is protuberant.     Comments: PD site C/D/I  Skin:    General: Skin is warm and dry.     Coloration: Skin is not jaundiced.  Neurological:     General: No focal deficit present.     Mental Status: He is alert.  Psychiatric:        Mood and Affect: Mood normal. Mood is not anxious.        Behavior: Behavior normal.      Patient Lines/Drains/Airways Status     Active Line/Drains/Airways     Name Placement date Placement time Site Days   Peripheral IV 07/07/23 20 G Right Antecubital 07/07/23  0835  Antecubital  1   Fistula / Graft Left Forearm Arteriovenous fistula 07/21/21  0839  Forearm  717   Incision (Closed) 07/21/21 Arm Left 07/21/21  0820  -- 717   Incision (Closed) 01/05/22 Arm Left 01/05/22  0804  -- 549   Incision (Closed) 05/17/22 Abdomen 05/17/22  1123  -- 417   Incision - 2 Ports Abdomen 1: Mid 2: Mid;Upper 05/17/22  1110  -- 417   Incision - 3 Ports Abdomen 1: Left;Medial 2: Left;Medial;Lower 3: Umbilicus 05/17/22  1100  -- 417             ASSESSMENT/PLAN:  Assessment: Principal Problem:   Peritonitis associated with peritoneal dialysis, initial encounter (HCC)  Plan: ##Abdominal pain #Concerns for peritonitis associated with peritoneal dialysis Patient's body fluid cell count with differential  showed nucleated cell count of 22,750  with 83% neutrophile count. - continue vanco and ceftazidime - Follow up on body fluid cultures - Optimize  Pain control   #ESRD #Volume Overloaded - Continue PD while inpatient  - Nephrology Recs appreciated  #HTN BP today is 118/66. Will continue to monitor and resume home antihypertensive when appropriate  #Liver Lesion  CT showed Benign-appearing peripherally calcified lesion in segment 4A of the liver, incompletely characterized, but similar in size to prior abdominal MRI 04/30/2018, presumably benign (potentially related to remote trauma).  #HLD - Continue home Lipitor when able    #Anemia - Hgb of 9.4. No signs of bleeding. Trend CBC - F/U  blood smear  - F/U iron studies   Best Practice: Diet: Renal diet VTE: heparin injection 5,000 Units Start: 07/07/23 1400 Code: Full AB: Vanco and ceftazidime Family Contact: Wife, called and notified. DISPO: Anticipated discharge tomorrow to Home pending IV antibiotics and PD  .  Signature: Kathleen Lime , MD Internal Medicine Resident, PGY-1 [facility]  Pager: [phone] 11:13 AM, 07/08/2023   Please contact the on call pager after 5 pm and on weekends at [phone].

## 2023-07-08 NOTE — Progress Notes (Signed)
Pharmacy Antibiotic Note  Joshua Mullins is a 47 y.o. male admitted on 07/07/2023 with concern for peritonitis.  Pharmacy has been consulted for vancomycin and Fortaz dosing.  Vancomycin 2g IV x 1 given in ED,   Patient remains afebrile, WBC 8.7, and CrCl 4.9 mL/min on PD.   Plan: Variable vancomycin dosing with PD Check vancomycin level in 2-3 days, re-dose when <20 mcg/mL Decrease to Fortaz 500 mg IV q 24h based on kidney function Monitor PD and nephro plans, Cx and clinical progression to narrow  Height: 5\' 10"  (177.8 cm) Weight: 102.2 kg (225 lb 5 oz) IBW/kg (Calculated) : 73  Temp (24hrs), Avg:98.3 F (36.8 C), Min:97.8 F (36.6 C), Max:99 F (37.2 C)  Recent Labs  Lab 07/07/23 0738 07/07/23 0745 07/07/23 1028 07/08/23 0415  WBC 7.9  --   --  8.7  CREATININE 23.60*  --   --  22.44*  LATICACIDVEN  --  1.1 0.7  --     Estimated Creatinine Clearance: 4.9 mL/min (A) (by C-G formula based on SCr of 22.44 mg/dL (H)).    Allergies  Allergen Reactions   Iodinated Contrast Media Other (See Comments) and Swelling    Other Reaction(s): Laryngeal Edema   Norvasc [Amlodipine] Swelling   Shellfish Allergy Swelling   Zestril [Lisinopril] Swelling   Antimicrobials  Vanc 11/16>> Ceftaz 11/16 >>  Cultures  11/16 Bcx: NGTD x 1 day 11/16 peritoneal fluid: NG  Roslyn Smiling, PharmD PGY1 Pharmacy Resident 07/08/2023 1:36 PM

## 2023-07-08 NOTE — Progress Notes (Signed)
PD tx initation note:   Pre TX VS:   Pre TX weight: 112kg  PD treatment initiated via aseptic technique. Consent signed and in chart. Patient is alert and oriented. No complaints of pain. specimen collected. PD exit site clean, dry and intact. Gentamycin and new dressing applied. Bedside RN educated on PD machine and how to contact tech support when PD machine alarms.    07/08/23 0150  Peritoneal Catheter   Placement Date/Time: 05/17/22 1135   Serial / Lot #: [id number]  Expiration Date: 01/17/26  Procedural Verification: Medical records & consent reviewed;Relevant studies,results and images reviewed;Site marked with initials  Time out: Correct Patient;Correc...  Site Assessment Clean, Dry, Intact  Drainage Description None  Catheter status Deaccessed  Dressing Gauze/Drain sponge  Dressing Status Clean, Dry, Intact  Dressing Intervention Removed  Cycler Setup  Total Number of Night Cycles 5  Night Fill Volume 2500  Dianeal Solution Dextrose 2.5% in 6000 mL Low Cal/Low Mag  Dianeal Additive Heparin  Night Dwell Time per Cycle - Hour(s) 1  Night Dwell Time per Cycle - Minute(s) 30  Night Time Therapy - Minute(s) 35  Night Time Therapy - Hour(s) 10  Minimum Initial Drain Volume 276  Maximum Peritoneal Volume 3750  Night/Total Therapy Volume 29562  Day Exchange No  Hand-off documentation  Hand-off Given Given to shift RN/LPN  Report given to (Full Name) Santina Trillo, RN  Hand-off Received Received from shift RN/LPN  Report received from (Full Name) Tracie Harrier, RN

## 2023-07-08 NOTE — Progress Notes (Addendum)
Dialysis nurse called to see what time PD would be started on pt. Dialysis nurse states that it will likely be after midnight. Also informed dialysis nurse of an culture that was needed to obtained, nurse stated that they are aware. Pt and wife updated of the above and has no further questions at this time.

## 2023-07-08 NOTE — Progress Notes (Signed)
Subjective: Seen in room, attempted to eat some breakfast, abdominal pain improving.  Still hooked up to CCPD machine tolerating changes.  Objective Vital signs in last 24 hours: Vitals:   07/07/23 2020 07/07/23 2326 07/08/23 0608 07/08/23 0839  BP: (!) 141/81 120/74  118/66  Pulse: 85 88 88 81  Resp: 18 12 13 10   Temp:  98.2 F (36.8 C)  97.8 F (36.6 C)  TempSrc:  Oral  Axillary  SpO2: 97% 93% 97% 97%  Weight:   102.2 kg   Height:       Weight change:   Physical Exam: General: Alert adult male NAD Heart: RRR no MRG Lungs: CTA bilaterally nonlabored breathing room air Abdomen: NABS soft minimal umbilical tenderness, PD catheter dressing dry intact Extremities: Trace pedal edema bilaterally Dialysis Access: PD catheter left quadrant dressing dry clean, left forearm AV fistula + bruit minimally developed  Renal-related home meds: - auryxia 3 ac - rocaltrol 0.5 mcg take 2 at bedtime - phoslo 3 ac tid - gabapentin 100 hs - hydralazine 50 bid - toprol xl 100mg  qam - sod bicarb 1 hs      OP PD: [facility] CCPD Dr Ronalee Belts  5 exchanges, 3 L fill, 1.5 hr dwell, dry wt 105kg   - uses mostly 2.5's and some 1.5s - missed 1 session earlier this week , and last night had 1/5 exchanges     Problem/Plan: Abd pain - cw peritonitis  with WBC 22,0000 PD fluid  Recommend empiric IV fortaz and IV vanc.  Obtain daily PD cell count until improved, pending  culture.  No organisms seen on Gram stain.  Pain control per pmd, use dilaudid or fentanyl for IV pain meds w/ esrd pts.  ESKD - on PD started Oct 2023. Has L forearm AVF never used, functioning.  Continue CCPD tonight. Will add heparin to bags.,  Noted BUN 95 creatinine 22.4.  Ask about compliance, did state he missed some exchanges last week HTN - BP's up a bit. Home meds prn per pmd.  Also use 2.5% PD Volume - mild vol overload on exam. Use all 2.5% bags Anemia of eskd - Hb 8- 10 range, follow, transfuse prn.  This a.m. Hgb 7.5 start  low-dose ESA MBD ckd - CCa on the low side. Cont po vdra./phos 8.8.  On Sherron Monday, PA-C [facility] Beeper [phone] 07/08/2023,12:00 PM  LOS: 0 days   Labs: Basic Metabolic Panel: Recent Labs  Lab 07/07/23 0738 07/08/23 0415  NA 138 134*  K 4.6 4.5  CL 99 97*  CO2 19* 19*  GLUCOSE 140* 181*  BUN 99* 95*  CREATININE 23.60* 22.44*  CALCIUM 7.8* 7.0*  PHOS 10.9* 8.8*   Liver Function Tests: Recent Labs  Lab 07/07/23 0738 07/08/23 0415  AST 7*  --   ALT 13  --   ALKPHOS 48  --   BILITOT 0.7  --   PROT 7.3  --   ALBUMIN 3.6 2.9*   No results for input(s): "LIPASE", "AMYLASE" in the last 168 hours. No results for input(s): "AMMONIA" in the last 168 hours. CBC: Recent Labs  Lab 07/07/23 0738 07/08/23 0415  WBC 7.9 8.7  NEUTROABS 6.3 7.0  HGB 9.4* 7.9*  HCT 29.2* 24.4*  MCV 98.0 96.4  PLT 220 195   Cardiac Enzymes: No results for input(s): "CKTOTAL", "CKMB", "CKMBINDEX", "TROPONINI" in the last 168 hours. CBG: Recent Labs  Lab 07/07/23 0853 07/07/23 1854 07/08/23 0605  GLUCAP  108* 110* 132*    Studies/Results: CT ABDOMEN PELVIS WO CONTRAST  Result Date: 07/07/2023 CLINICAL DATA:  47 year old male with history of acute onset of nonlocalized abdominal pain. EXAM: CT ABDOMEN AND PELVIS WITHOUT CONTRAST TECHNIQUE: Multidetector CT imaging of the abdomen and pelvis was performed following the standard protocol without IV contrast. RADIATION DOSE REDUCTION: This exam was performed according to the departmental dose-optimization program which includes automated exposure control, adjustment of the mA and/or kV according to patient size and/or use of iterative reconstruction technique. COMPARISON:  No priors. FINDINGS: Lower chest: Unremarkable. Hepatobiliary: Centrally low-attenuation peripherally calcified lesion in segment 4A of the liver (axial image 15 of series 3) measuring 3.6 x 2.9 cm, incompletely characterized on today's  noncontrast CT examination, but strongly favored to be benign (potentially related to remote trauma). No other definite suspicious hepatic lesions are noted. Unenhanced appearance of the gallbladder is unremarkable. Pancreas: No definite pancreatic mass or peripancreatic fluid collections or inflammatory changes are noted on today's noncontrast CT examination. Spleen: Unremarkable. Adrenals/Urinary Tract: Unenhanced appearance of the kidneys and bilateral adrenal glands is normal. No hydroureteronephrosis. Urinary bladder is unremarkable in appearance. Stomach/Bowel: Unenhanced appearance of the stomach is normal. No pathologic dilatation of small bowel or colon. Numerous colonic diverticuli are noted, without surrounding inflammatory changes to indicate an acute diverticulitis at this time. Normal caliber appendix containing some appendicoliths, without definite focal surrounding inflammatory changes. Vascular/Lymphatic: Atherosclerotic calcifications in the abdominal aorta and pelvic vasculature. No definite lymphadenopathy noted in the abdomen or pelvis. Reproductive: Prostate gland and seminal vesicles are unremarkable in appearance. Other: Tenckhoff catheter coiled in the low anatomic pelvis. Small volume of fluid in the peritoneal cavity, presumably peritoneal dialysate. Trace volume of pneumoperitoneum, presumably iatrogenic. Musculoskeletal: There are no aggressive appearing lytic or blastic lesions noted in the visualized portions of the skeleton. IMPRESSION: 1. No definite acute findings confidently identified in the abdomen or pelvis to account for the patient's symptoms. Tenckhoff dialysis catheter appears appropriately located. Small volume of dialysate and trace amount of pneumoperitoneum, presumably iatrogenic. 2. The appendix is normal in caliber and contains multiple appendicoliths. No definite focal surrounding inflammatory changes are confidently identified (assessment is slightly limited by  presence of peritoneal dialysate). 3. Benign-appearing peripherally calcified lesion in segment 4A of the liver, incompletely characterized, but similar in size to prior abdominal MRI 04/30/2018, presumably benign (potentially related to remote trauma). 4. Aortic atherosclerosis. Electronically Signed   By: Trudie Reed M.D.   On: 07/07/2023 10:08   DG Chest 2 View  Result Date: 07/07/2023 CLINICAL DATA:  Sepsis EXAM: CHEST - 2 VIEW COMPARISON:  08/02/2006 FINDINGS: Normal heart size and mediastinal contours. No acute infiltrate or edema. No effusion or pneumothorax. No acute osseous findings. IMPRESSION: Negative chest. Electronically Signed   By: Tiburcio Pea M.D.   On: 07/07/2023 07:57   Medications:  cefTAZidime (FORTAZ)  IV 1 g (07/08/23 0941)    acetaminophen  1,000 mg Oral Q6H   atorvastatin  10 mg Oral q AM   calcitRIOL  1 mcg Oral QPM   ferric citrate  630 mg Oral TID WC   gentamicin cream  1 Application Topical Daily   heparin  5,000 Units Subcutaneous Q8H    HYDROmorphone (DILAUDID) injection  1 mg Intravenous Q4H   latanoprost  1 drop Both Eyes QHS   sodium chloride flush  3 mL Intravenous Q12H   vancomycin variable dose per unstable renal function (pharmacist dosing)   Does not apply See admin instructions

## 2023-07-08 NOTE — Progress Notes (Signed)
PD tx initation note:     07/08/23 2020  Peritoneal Catheter   Placement Date/Time: 05/17/22 1135   Serial / Lot #: [id number]  Expiration Date: 01/17/26  Procedural Verification: Medical records & consent reviewed;Relevant studies,results and images reviewed;Site marked with initials  Time out: Correct Patient;Correc...  Site Assessment Clean, Dry, Intact  Drainage Description None  Catheter status Accessed  Dressing Gauze/Drain sponge  Dressing Status Clean, Dry, Intact  Dressing Intervention Dressing changed  Completion  Treatment Status Started  Hand-off documentation  Hand-off Given Given to shift RN/LPN  Report given to (Full Name) Tracie Harrier, RN  Hand-off Received Received from shift RN/LPN  Report received from (Full Name) Princess Bruins, RN

## 2023-07-09 DIAGNOSIS — Z992 Dependence on renal dialysis: Secondary | ICD-10-CM | POA: Diagnosis not present

## 2023-07-09 DIAGNOSIS — N186 End stage renal disease: Secondary | ICD-10-CM

## 2023-07-09 DIAGNOSIS — T8571XA Infection and inflammatory reaction due to peritoneal dialysis catheter, initial encounter: Secondary | ICD-10-CM

## 2023-07-09 LAB — CBC WITH DIFFERENTIAL/PLATELET
Abs Immature Granulocytes: 0.09 10*3/uL — ABNORMAL HIGH (ref 0.00–0.07)
Basophils Absolute: 0 10*3/uL (ref 0.0–0.1)
Basophils Relative: 0 %
Eosinophils Absolute: 0.1 10*3/uL (ref 0.0–0.5)
Eosinophils Relative: 2 %
HCT: 27.2 % — ABNORMAL LOW (ref 39.0–52.0)
Hemoglobin: 9 g/dL — ABNORMAL LOW (ref 13.0–17.0)
Immature Granulocytes: 1 %
Lymphocytes Relative: 13 %
Lymphs Abs: 1.1 10*3/uL (ref 0.7–4.0)
MCH: 31.4 pg (ref 26.0–34.0)
MCHC: 33.1 g/dL (ref 30.0–36.0)
MCV: 94.8 fL (ref 80.0–100.0)
Monocytes Absolute: 0.7 10*3/uL (ref 0.1–1.0)
Monocytes Relative: 8 %
Neutro Abs: 6.7 10*3/uL (ref 1.7–7.7)
Neutrophils Relative %: 76 %
Platelets: 231 10*3/uL (ref 150–400)
RBC: 2.87 MIL/uL — ABNORMAL LOW (ref 4.22–5.81)
RDW: 13.2 % (ref 11.5–15.5)
WBC: 8.7 10*3/uL (ref 4.0–10.5)
nRBC: 0 % (ref 0.0–0.2)

## 2023-07-09 LAB — RENAL FUNCTION PANEL
Albumin: 3.1 g/dL — ABNORMAL LOW (ref 3.5–5.0)
Anion gap: 19 — ABNORMAL HIGH (ref 5–15)
BUN: 76 mg/dL — ABNORMAL HIGH (ref 6–20)
CO2: 21 mmol/L — ABNORMAL LOW (ref 22–32)
Calcium: 7.7 mg/dL — ABNORMAL LOW (ref 8.9–10.3)
Chloride: 93 mmol/L — ABNORMAL LOW (ref 98–111)
Creatinine, Ser: 16.96 mg/dL — ABNORMAL HIGH (ref 0.61–1.24)
GFR, Estimated: 3 mL/min — ABNORMAL LOW (ref >60)
Glucose, Bld: 132 mg/dL — ABNORMAL HIGH (ref 70–99)
Phosphorus: 7.2 mg/dL — ABNORMAL HIGH (ref 2.5–4.6)
Potassium: 4.2 mmol/L (ref 3.5–5.1)
Sodium: 133 mmol/L — ABNORMAL LOW (ref 135–145)

## 2023-07-09 LAB — FERRITIN: Ferritin: 709 ng/mL — ABNORMAL HIGH (ref 24–336)

## 2023-07-09 LAB — IRON AND TIBC
Iron: 42 ug/dL — ABNORMAL LOW (ref 45–182)
Saturation Ratios: 19 % (ref 17.9–39.5)
TIBC: 217 ug/dL — ABNORMAL LOW (ref 250–450)
UIBC: 175 ug/dL

## 2023-07-09 LAB — VANCOMYCIN, RANDOM: Vancomycin Rm: 22 ug/mL

## 2023-07-09 LAB — GLUCOSE, CAPILLARY: Glucose-Capillary: 120 mg/dL — ABNORMAL HIGH (ref 70–99)

## 2023-07-09 LAB — MAGNESIUM: Magnesium: 1.8 mg/dL (ref 1.7–2.4)

## 2023-07-09 LAB — PATHOLOGIST SMEAR REVIEW

## 2023-07-09 MED ORDER — DARBEPOETIN ALFA 60 MCG/0.3ML IJ SOSY: 60.0000 ug | PREFILLED_SYRINGE | INTRAMUSCULAR | Status: DC

## 2023-07-09 MED ORDER — METOPROLOL TARTRATE 100 MG PO TABS
100.0000 mg | ORAL_TABLET | Freq: Every day | ORAL | Status: DC
  Administered 2023-07-09 – 2023-07-10 (×2): 100 mg via ORAL
  Filled 2023-07-09 (×2): qty 1

## 2023-07-09 MED ORDER — HYDROMORPHONE HCL 2 MG PO TABS
1.0000 mg | ORAL_TABLET | Freq: Four times a day (QID) | ORAL | Status: DC | PRN
  Administered 2023-07-09 – 2023-07-10 (×2): 1 mg via ORAL
  Filled 2023-07-09 (×2): qty 1

## 2023-07-09 MED ORDER — OXYCODONE HCL 5 MG PO TABS: 5.0000 mg | ORAL_TABLET | Freq: Four times a day (QID) | ORAL | Status: DC | PRN

## 2023-07-09 MED ORDER — HYDRALAZINE HCL 50 MG PO TABS
50.0000 mg | ORAL_TABLET | Freq: Two times a day (BID) | ORAL | Status: DC
  Administered 2023-07-09 – 2023-07-10 (×3): 50 mg via ORAL
  Filled 2023-07-09 (×3): qty 1

## 2023-07-09 MED ORDER — DELFLEX-LC/2.5% DEXTROSE 394 MOSM/L IP SOLN: INTRAPERITONEAL | Status: DC

## 2023-07-09 NOTE — Plan of Care (Signed)

## 2023-07-09 NOTE — Progress Notes (Signed)
  Progress Note   Date: 07/09/2023  Patient Name: Joshua Mullins        MRN#: [id number]  Review the patient's clinical findings supports the diagnosis of:   Obesity class I

## 2023-07-09 NOTE — Progress Notes (Signed)
Physical Therapy Treatment Patient Details Name: Joshua Mullins MRN: [id number] DOB: 1976-08-14 Today's Date: 07/09/2023   History of Present Illness The patient is a 47 y.o. year-old admitted 11/16 with c/o sudden onset gen'd abd pain during his PD. He has been having milder abd pains for a few days prior, but this was unbearable. Vomited several times. PMH: Has ESRD and is on PD nightly since 2023,HTN,HLD and Diabetes    PT Comments  Patient reports feeling better today but not back to baseline. Abdominal pain improved and no nausea/vomiting reported today. Increased activity tolerance this session. Patient ambulated in hallway with CGA initially , progressing to supervision with increased ambulation distance. Recommend to continue PT while in the hospital to maximize independence and decrease caregiver burden. No PT needs anticipated after this hospital stay.    If plan is discharge home, recommend the following: Assist for transportation;Help with stairs or ramp for entrance   Can travel by private vehicle        Equipment Recommendations  None recommended by PT    Recommendations for Other Services       Precautions / Restrictions Precautions Precautions: Fall Restrictions Weight Bearing Restrictions: No     Mobility  Bed Mobility Overal bed mobility: Modified Independent       Supine to sit: HOB elevated          Transfers Overall transfer level: Needs assistance Equipment used: None Transfers: Sit to/from Stand Sit to Stand: Supervision                Ambulation/Gait Ambulation/Gait assistance: Contact guard assist, Supervision Gait Distance (Feet): 200 Feet Assistive device: None Gait Pattern/deviations: Step-through pattern, Decreased stride length, Narrow base of support Gait velocity: decreased     General Gait Details: occasional narrow base of support. CGA provided initially progressing to supervision with increased ambulation distance. no  dizziness or increased pain is reported with mobility. mild fatigue with activity   Stairs             Wheelchair Mobility     Tilt Bed    Modified Rankin (Stroke Patients Only)       Balance Overall balance assessment: Needs assistance Sitting-balance support: Feet supported Sitting balance-Leahy Scale: Normal     Standing balance support: No upper extremity supported Standing balance-Leahy Scale: Good                              Cognition Arousal: Alert Behavior During Therapy: WFL for tasks assessed/performed Overall Cognitive Status: Within Functional Limits for tasks assessed                                          Exercises      General Comments General comments (skin integrity, edema, etc.): No LOB walking in room or ourside in hallway.      Pertinent Vitals/Pain Pain Assessment Pain Assessment: 0-10 Pain Score: 3  Pain Location: abdomen Pain Descriptors / Indicators: Aching, Discomfort Pain Intervention(s): Limited activity within patient's tolerance, Monitored during session, Repositioned    Home Living Family/patient expects to be discharged to:: Private residence Living Arrangements: Spouse/significant other Available Help at Discharge: Family;Available PRN/intermittently Type of Home: House Home Access: Stairs to enter Entrance Stairs-Rails: None Entrance Stairs-Number of Steps: 3   Home Layout: One level Home Equipment: Information systems manager  Prior Function            PT Goals (current goals can now be found in the care plan section) Acute Rehab PT Goals Patient Stated Goal: to go home PT Goal Formulation: With patient Time For Goal Achievement: 07/22/23 Potential to Achieve Goals: Good Progress towards PT goals: Progressing toward goals    Frequency    Min 1X/week      PT Plan      Co-evaluation              AM-PAC PT "6 Clicks" Mobility   Outcome Measure  Help needed turning from  your back to your side while in a flat bed without using bedrails?: None Help needed moving from lying on your back to sitting on the side of a flat bed without using bedrails?: A Little Help needed moving to and from a bed to a chair (including a wheelchair)?: A Little Help needed standing up from a chair using your arms (e.g., wheelchair or bedside chair)?: A Little Help needed to walk in hospital room?: A Little Help needed climbing 3-5 steps with a railing? : A Little 6 Click Score: 19    End of Session   Activity Tolerance: Patient tolerated treatment well;Patient limited by fatigue Patient left: in bed;with call bell/phone within reach Nurse Communication: Mobility status PT Visit Diagnosis: Muscle weakness (generalized) (M62.81)     Time: 2130-8657 PT Time Calculation (min) (ACUTE ONLY): 14 min  Charges:    $Therapeutic Activity: 8-22 mins PT General Charges $$ ACUTE PT VISIT: 1 Visit                     Donna Bernard, PT, MPT    Ina Homes 07/09/2023, 1:16 PM

## 2023-07-09 NOTE — Evaluation (Signed)
Occupational Therapy Evaluation and Discharge Summary Patient Details Name: Joshua Mullins MRN: [id number] DOB: April 13, 1976 Today's Date: 07/09/2023   History of Present Illness The patient is a 47 y.o. year-old admitted 11/16 with c/o sudden onset gen'd abd pain during his PD. He has been having milder abd pains for a few days prior, but this was unbearable. Vomited several times. PMH: Has ESRD and is on PD nightly since 2023,HTN,HLD and Diabetes   Clinical Impression   Pt admitted with the above diagnosis and overall is very close to baseline.  Pt mobilized in room without an AD and completed all basic adls without assist. Pt states he feels slightly weak but much better than on admission.  Pt is normally independent at home and feels like he will be able to hand ADLS at home.  Encouraged pt to do for himself but also ease back into cooking/cleaning/driving etc.  No further OT needs at this time.        If plan is discharge home, recommend the following: Assistance with cooking/housework    Functional Status Assessment  Patient has not had a recent decline in their functional status  Equipment Recommendations  None recommended by OT    Recommendations for Other Services       Precautions / Restrictions Precautions Precautions: Fall Restrictions Weight Bearing Restrictions: No      Mobility Bed Mobility Overal bed mobility: Independent             General bed mobility comments: Pt did not need physical assist to get OOB    Transfers Overall transfer level: Needs assistance Equipment used: 1 person hand held assist Transfers: Sit to/from Stand, Bed to chair/wheelchair/BSC Sit to Stand: Supervision     Step pivot transfers: Supervision     General transfer comment: Supervision given as this was first time seeing pt.  Pt did not require any hands on assist and feel, other than mobilizing slowly, pt does not need assist.      Balance Overall balance  assessment: No apparent balance deficits (not formally assessed)                                         ADL either performed or assessed with clinical judgement   ADL Overall ADL's : At baseline                                       General ADL Comments: Pt feels slightly weak from events of last few days but is at baseline with all his basic adls. Pt does most of cooking at home and does drive. Instructed pt to slowly resume activities but also rest as needed.     Vision Baseline Vision/History: 0 No visual deficits Ability to See in Adequate Light: 0 Adequate Patient Visual Report: No change from baseline Vision Assessment?: No apparent visual deficits     Perception Perception: Within Functional Limits       Praxis Praxis: WFL       Pertinent Vitals/Pain Pain Assessment Pain Assessment: 0-10 Pain Score: 3  Pain Location: abdomen Pain Descriptors / Indicators: Aching, Discomfort Pain Intervention(s): Monitored during session, Repositioned     Extremity/Trunk Assessment Upper Extremity Assessment Upper Extremity Assessment: Overall WFL for tasks assessed   Lower Extremity Assessment Lower Extremity Assessment: Defer  to PT evaluation   Cervical / Trunk Assessment Cervical / Trunk Assessment: Normal   Communication Communication Communication: No apparent difficulties   Cognition Arousal: Alert Behavior During Therapy: WFL for tasks assessed/performed Overall Cognitive Status: Within Functional Limits for tasks assessed                                       General Comments  No LOB walking in room or ourside in hallway.    Exercises     Shoulder Instructions      Home Living Family/patient expects to be discharged to:: Private residence Living Arrangements: Spouse/significant other Available Help at Discharge: Family;Available PRN/intermittently Type of Home: House Home Access: Stairs to enter [employer] of Steps: 3 Entrance Stairs-Rails: None Home Layout: One level     Bathroom Shower/Tub: Producer, television/film/video: Standard     Home Equipment: Shower seat          Prior Functioning/Environment Prior Level of Function : Independent/Modified Independent;Driving             Mobility Comments: independent ADLs Comments: independing        OT Problem List:        OT Treatment/Interventions:      OT Goals(Current goals can be found in the care plan section) Acute Rehab OT Goals Patient Stated Goal: to go home OT Goal Formulation: All assessment and education complete, DC therapy  OT Frequency:      Co-evaluation              AM-PAC OT "6 Clicks" Daily Activity     Outcome Measure Help from another person eating meals?: None Help from another person taking care of personal grooming?: None Help from another person toileting, which includes using toliet, bedpan, or urinal?: None Help from another person bathing (including washing, rinsing, drying)?: None Help from another person to put on and taking off regular upper body clothing?: None Help from another person to put on and taking off regular lower body clothing?: None 6 Click Score: 24   End of Session Nurse Communication: Mobility status  Activity Tolerance: Patient tolerated treatment well Patient left: in bed;with call bell/phone within reach  OT Visit Diagnosis: Unsteadiness on feet (R26.81)                Time: 4098-1191 OT Time Calculation (min): 20 min Charges:  OT General Charges $OT Visit: 1 Visit OT Evaluation $OT Eval Low Complexity: 1 Low  Hope Budds 07/09/2023, 12:39 PM

## 2023-07-09 NOTE — Progress Notes (Signed)
Pharmacy Antibiotic Note  Joshua Mullins is a 47 y.o. male admitted on 07/07/2023 with concern for  peritonitis  .  Pharmacy has been consulted for vancomycin dosing.  Plan: Variable vancomycin dosing with peritoneal dialysis. Will check a random vancomycin level after 2-3 days (commenced on 16-NOV-24). Will check with 0500 labs on 19-NOV-24.   Height: 5\' 10"  (177.8 cm) Weight: 107 kg (235 lb 12.8 oz) IBW/kg (Calculated) : 73  Temp (24hrs), Avg:98 F (36.7 C), Min:97.7 F (36.5 C), Max:98.2 F (36.8 C)  Recent Labs  Lab 07/07/23 0738 07/07/23 0745 07/07/23 1028 07/08/23 0415 07/09/23 0607 07/09/23 1026  WBC 7.9  --   --  8.7  --  8.7  CREATININE 23.60*  --   --  22.44* 16.96*  --   LATICACIDVEN  --  1.1 0.7  --   --   --     Estimated Creatinine Clearance: 6.7 mL/min (A) (by C-G formula based on SCr of 16.96 mg/dL (H)).    Allergies  Allergen Reactions   Iodinated Contrast Media Other (See Comments) and Swelling    Other Reaction(s): Laryngeal Edema   Norvasc [Amlodipine] Swelling   Shellfish Allergy Swelling   Zestril [Lisinopril] Swelling    Antimicrobials this admission: Vancomycin initiating dose administered on 16-NOV-24 Ceftazidime initiating dose administered on 16-NOV-24 and then every 24 hours thereafter based on renal function with continued monitoring and assessment of plans per Nephrology Service.   Dose adjustments this admission: See above.  Microbiology results: 11/16 Bcx: NGTD x 2 day 11/16 peritoneal fluid: NG @ 1 day Thank you for allowing pharmacy to be a part of this patient's care.  Elicia Lamp, PharmD, CPP 07/09/2023 12:41 PM

## 2023-07-09 NOTE — Progress Notes (Signed)
Pts PD catheter closed o7ff post tx and secured to his abdomen--please see the flowsheets for vital signs and PD numbers----

## 2023-07-09 NOTE — Progress Notes (Addendum)
No heparin add to PD solution d/t high census. Dr. Signe Colt notified.

## 2023-07-09 NOTE — Progress Notes (Signed)
HD#2 SUBJECTIVE:  Patient Summary: Joshua Mullins is a 47 y.o. with a pertinent PMH of ESRD on PD, HTN, HLD and T2DM who presented to the ED on 07/07/23 for 4 days of progressive abdominal pain, nausea and vomiting in the setting of a cloudy dialysate. He was admitted to the Internal Medicine Teaching Service for bacterial peritonitis.   Overnight Events:   Patient reported nausea following IV dilaudid dose, received IV Zofran 4 mg x 1 dose.   Interim History:   Patient endorsed some SOB during PD, feels back to baseline now. Denies SOB, CP, headache, or heart palpitations. Endorses overall decreased appetite but nausea is improved, no further episodes of dry heaving or vomiting. Had some breakfast this morning, will try to eat something for lunch. Patient discussed changing IV dilaudid for another pain medication with Dr. Arlean Hopping earlier this morning given his nausea and decreased appetite, which he attributes to the dilaudid.    OBJECTIVE:  Vital Signs: Vitals:   07/09/23 0008 07/09/23 0100 07/09/23 0616 07/09/23 0754  BP:  (!) 156/89  (!) 159/86  Pulse: 88 90  86  Resp: 11 12  16   Temp:    98.2 F (36.8 C)  TempSrc:    Oral  SpO2: 99% 98%  100%  Weight:   107 kg   Height:       Supplemental O2: Room Air SpO2: 100 % O2 Flow Rate (L/min): 2 L/min  Filed Weights   07/07/23 0732 07/08/23 0608 07/09/23 0616  Weight: 102.1 kg 102.2 kg 107 kg     Intake/Output Summary (Last 24 hours) at 07/09/2023 1140 Last data filed at 07/08/2023 1759 Gross per 24 hour  Intake 700 ml  Output 189 ml  Net 511 ml   Net IO Since Admission: 751 mL [07/09/23 1140]  Physical Exam: Constitutional/General: Patient is resting in bed in no acute distress, conversing appropriately.  CV: RRR, no m/r/g, cooler bilateral lower extremities, intact b/l radial and pedal pulses.  Abdominal: Appears slightly distended, no overlying skin changes, PD access site clean and dry, no overt bleeding  observed, some infraumbilical tenderness across the lower abdomen on palpation.  Skin: Dry, not jaundiced. Neurological: Alert and oriented, no focal deficit observed. Answering and asking questions appropriately.  Psych: Normal mood and affect.   Patient Lines/Drains/Airways Status     Active Line/Drains/Airways     Name Placement date Placement time Site Days   Peripheral IV 07/07/23 20 G Right Antecubital 07/07/23  0835  Antecubital  1   Fistula / Graft Left Forearm Arteriovenous fistula 07/21/21  0839  Forearm  717   Incision (Closed) 07/21/21 Arm Left 07/21/21  0820  -- 717   Incision (Closed) 01/05/22 Arm Left 01/05/22  0804  -- 549   Incision (Closed) 05/17/22 Abdomen 05/17/22  1123  -- 417   Incision - 2 Ports Abdomen 1: Mid 2: Mid;Upper 05/17/22  1110  -- 417   Incision - 3 Ports Abdomen 1: Left;Medial 2: Left;Medial;Lower 3: Umbilicus 05/17/22  1100  -- 417            ASSESSMENT/PLAN:  Assessment: Principal Problem:   Peritonitis associated with peritoneal dialysis, initial encounter [facility])  Joshua Mullins is a 47 y.o. with a pertinent PMH of ESRD on PD, HTN, HLD and T2DM who presented to the ED on 07/07/23 for 4 days of progressive abdominal pain, nausea and vomiting in the setting of a cloudy dialysate. He was admitted to the Internal Medicine Teaching  Service for bacterial peritonitis.   Plan: Bacterial peritonitis associated with peritoneal dialysis Abdominal pain- improving Body fluid with nucleated cell count of 22.750 with 83% neutrophil count. Remains afebrile on IV antibiotics, WBC count improving. Body fluid culture w/ gram stain on 11/16 showing moderate WBC, predominantly PMN, no orgasms seen and no growth in 1 day. Patient continues to get daily PD with abdominal pain improving. Will change pain control management given nausea with IV dilaudid. No new concerns from physical exam, PD site dry and clean. Nephrology following, continue to appreciate their  recommendations. Per Dr. Arlean Hopping, can transition from IV antibiotics to peritoneal administered antibiotics at discharge.  Plan:  - Continue IV vancomycin and IV ceftazidime - Discontinued IV dilaudid, START PO dilaudid 1 mg q6H PRN for severe pain  - Continue scheduled Tylenol 1000 mg q6H for mild to moderate pain  - Follow up on body fluid culture and adjust antibiotic therapy as needed  ESRD Volume Overload PD session this morning, followed by inpatient nephrology. Patient tolerating well, experiences some SOB during sessions with a return to baseline after PD. Denied SOB during today's exam. Baseline swelling/ascites in abdomen, stable. Continue to monitor.  Plan: - Continue PD while inpatient   HTN Blood pressures today elevated compared to yesterday. Systolic BP 150s-160s and diastolic BP 80s-90s. HR 80s-90s. Patient denies CP, headache, or heart palpitations. CV exam unremarkable. Home antihypertensives include metoprolol 100 mg daily, hydralazine 50 mg BID, and lasix 80 mg daily. Will resume metoprolol and hydralazine today.  - START metoprolol 100 mg daily - START hydralazine 50 mg BID   Liver Lesion  CT on admission showed benign-appearing peripherally calcified lesion in segment 4A of the liver, incompletely characterized, similar in size to prior abdominal MRI 04/30/2018. Radiology did not note a need for follow up. Will discuss with patient tomorrow to determine if he is aware of lesion and make sure he knows there are no changes and no need for follow up at this time.   HLD On home statin, resumed 07/08/23. - Continue atorvastatin 10 mg daily   Anemia Hemoglobin of 9.0 today, baseline hemoglobin seems to fluctuate 9-11. Iron studies include elevated ferritin 709, iron low at 42, and TIBC low at 217, likely indicating a functional iron deficiency related to patient's ESRD. Blood smear pending. No overt bleeding on exam. Will monitor.  - Lab holiday 11/19  Best  Practice: Diet: Renal diet VTE: heparin injection 5,000 Units Start: 07/07/23 1400 Code: Full Family Contact: Patient's spouse DISPO: Anticipated discharge pending IV antibiotics, PD.  Signature: Calden Dorsey Colbert Coyer, MD  Internal Medicine Resident, PGY-1 [facility]  11:40 AM, 07/09/2023   Please contact the on call pager after 5 pm and on weekends at [phone].

## 2023-07-09 NOTE — Progress Notes (Signed)
Subjective: Seen in room, abd pain continues to improve. C/o N/V after IV dilaudid and wants to try a different pain medication.   Objective Vital signs in last 24 hours: Vitals:   07/09/23 0754 07/09/23 1201 07/09/23 1226 07/09/23 1321  BP: (!) 159/86 (!) 150/88 (!) 150/88   Pulse: 86 95 95   Resp: 16 12    Temp: 98.2 F (36.8 C) 98.1 F (36.7 C)    TempSrc: Oral Oral    SpO2: 100% 100%    Weight:    108.9 kg  Height:       Weight change: 4.899 kg  Physical Exam: General: Alert adult male NAD Heart: RRR no MRG Lungs: CTA bilaterally nonlabored breathing room air Abdomen: NABS soft minimal umbilical tenderness Extremities: Trace pedal edema bilaterally Dialysis Access: PD catheter left quadrant dressing dry clean LFA AVF+bruit but minimally developed  Renal-related home meds: - auryxia 3 ac - rocaltrol 0.5 mcg take 2 at bedtime - phoslo 3 ac tid - gabapentin 100 hs - hydralazine 50 bid - toprol xl 100mg  qam - sod bicarb 1 hs      OP PD: [facility] CCPD Dr Ronalee Belts  5 exchanges, 3 L fill, 1.5 hr dwell, dry wt 105kg   - uses mostly 2.5's and some 1.5s - missed 1 session earlier this week , and last night had 1/5 exchanges     Problem/Plan: PD cath-related peritonitis  - w/ wbc count 22,000 initially. Started on empiric IV fortaz and IV vanc. Cell count yesterday was down to 336 and pain is improving. PD fluid cultures are negative to date. Cont IV abx w/ IV vanc/ fortaz. When pain and eating are close to baseline pt can be dc'd home at which time we will transition to IP antibiotics.  ESKD - on PD started Oct 2023. Has L forearm AVF never used but is not very well matured.  Continue CCPD here w/ added heparin to bags. HTN - BP's up a bit. Home meds prn per pmd.  Volume - mild vol overload on exam. 3kg up by wts. Cont 2.5% fluids.  Anemia of eskd - Hb 8- 10 range, follow, transfuse prn.  Hb down to 7.5, so we started low-dose ESA w/ darbe  60 mcg sq weekly on Sundays.  MBD ckd  - CCa on the low side. Cont po vdra./phos 8.8.  Cont Maryclare Labrador  MD  [facility] 07/09/2023, 1:34 PM  Recent Labs  Lab 07/08/23 0415 07/09/23 0607 07/09/23 1026  HGB 7.9*  --  9.0*  ALBUMIN 2.9* 3.1*  --   CALCIUM 7.0* 7.7*  --   PHOS 8.8* 7.2*  --   CREATININE 22.44* 16.96*  --   K 4.5 4.2  --     Inpatient medications:  acetaminophen  1,000 mg Oral Q6H   atorvastatin  10 mg Oral q AM   calcitRIOL  1 mcg Oral QPM   [START ON 07/15/2023] darbepoetin (ARANESP) injection - DIALYSIS  60 mcg Subcutaneous Q Sun-1800   ferric citrate  630 mg Oral TID WC   gentamicin cream  1 Application Topical Daily   heparin  5,000 Units Subcutaneous Q8H   hydrALAZINE  50 mg Oral BID   latanoprost  1 drop Both Eyes QHS   metoprolol tartrate  100 mg Oral Daily   scopolamine  1 patch Transdermal Q72H   sodium chloride flush  3 mL Intravenous Q12H   vancomycin variable dose per unstable renal function (pharmacist dosing)  Does not apply See admin instructions    cefTAZidime (FORTAZ)  IV 0.5 g (07/09/23 1037)   heparin sodium (porcine) 3,000 Units in dialysis solution 2.5% low-MG/low-CA 6,000 mL dialysis solution, HYDROmorphone, senna-docusate, trimethobenzamide

## 2023-07-10 ENCOUNTER — Other Ambulatory Visit (HOSPITAL_COMMUNITY): Payer: Self-pay

## 2023-07-10 DIAGNOSIS — N186 End stage renal disease: Secondary | ICD-10-CM | POA: Diagnosis not present

## 2023-07-10 DIAGNOSIS — Z992 Dependence on renal dialysis: Secondary | ICD-10-CM | POA: Diagnosis not present

## 2023-07-10 DIAGNOSIS — T8571XA Infection and inflammatory reaction due to peritoneal dialysis catheter, initial encounter: Secondary | ICD-10-CM | POA: Diagnosis not present

## 2023-07-10 LAB — RENAL FUNCTION PANEL
Albumin: 2.7 g/dL — ABNORMAL LOW (ref 3.5–5.0)
Anion gap: 14 (ref 5–15)
BUN: 63 mg/dL — ABNORMAL HIGH (ref 6–20)
CO2: 25 mmol/L (ref 22–32)
Calcium: 7.3 mg/dL — ABNORMAL LOW (ref 8.9–10.3)
Chloride: 93 mmol/L — ABNORMAL LOW (ref 98–111)
Creatinine, Ser: 15.83 mg/dL — ABNORMAL HIGH (ref 0.61–1.24)
GFR, Estimated: 3 mL/min — ABNORMAL LOW (ref >60)
Glucose, Bld: 161 mg/dL — ABNORMAL HIGH (ref 70–99)
Phosphorus: 7 mg/dL — ABNORMAL HIGH (ref 2.5–4.6)
Potassium: 3.5 mmol/L (ref 3.5–5.1)
Sodium: 132 mmol/L — ABNORMAL LOW (ref 135–145)

## 2023-07-10 LAB — CBC
HCT: 25.7 % — ABNORMAL LOW (ref 39.0–52.0)
Hemoglobin: 8.5 g/dL — ABNORMAL LOW (ref 13.0–17.0)
MCH: 30.9 pg (ref 26.0–34.0)
MCHC: 33.1 g/dL (ref 30.0–36.0)
MCV: 93.5 fL (ref 80.0–100.0)
Platelets: 231 10*3/uL (ref 150–400)
RBC: 2.75 MIL/uL — ABNORMAL LOW (ref 4.22–5.81)
RDW: 13.4 % (ref 11.5–15.5)
WBC: 7.8 10*3/uL (ref 4.0–10.5)
nRBC: 0 % (ref 0.0–0.2)

## 2023-07-10 LAB — VANCOMYCIN, RANDOM: Vancomycin Rm: 17 ug/mL

## 2023-07-10 LAB — GLUCOSE, CAPILLARY: Glucose-Capillary: 132 mg/dL — ABNORMAL HIGH (ref 70–99)

## 2023-07-10 MED ORDER — OXYCODONE HCL 5 MG PO TABS: 5.0000 mg | ORAL_TABLET | Freq: Four times a day (QID) | ORAL | Status: DC | PRN

## 2023-07-10 MED ORDER — FUROSEMIDE 40 MG PO TABS
80.0000 mg | ORAL_TABLET | Freq: Every day | ORAL | Status: DC
  Administered 2023-07-10: 80 mg via ORAL
  Filled 2023-07-10: qty 2

## 2023-07-10 MED ORDER — OXYCODONE HCL 5 MG PO TABS
5.0000 mg | ORAL_TABLET | Freq: Three times a day (TID) | ORAL | 0 refills | Status: DC | PRN
  Filled 2023-07-10: qty 6, 2d supply, fill #0

## 2023-07-10 MED ORDER — FUROSEMIDE 40 MG PO TABS
80.0000 mg | ORAL_TABLET | Freq: Every day | ORAL | Status: DC
  Filled 2023-07-10: qty 2

## 2023-07-10 MED ORDER — ACETAMINOPHEN 500 MG PO TABS
1000.0000 mg | ORAL_TABLET | Freq: Four times a day (QID) | ORAL | 0 refills | Status: AC
  Filled 2023-07-10: qty 30, 4d supply, fill #0

## 2023-07-10 MED ORDER — OXYCODONE HCL 5 MG PO TABS: 5.0000 mg | ORAL_TABLET | Freq: Three times a day (TID) | ORAL | Status: DC | PRN

## 2023-07-10 MED ORDER — VANCOMYCIN HCL 1250 MG/250ML IV SOLN
1250.0000 mg | Freq: Once | INTRAVENOUS | Status: AC
Start: 2023-07-10 — End: 2023-07-10
  Administered 2023-07-10: 1250 mg via INTRAVENOUS
  Filled 2023-07-10: qty 250

## 2023-07-10 MED ORDER — OXYCODONE HCL 5 MG PO TABS: 2.5000 mg | ORAL_TABLET | Freq: Four times a day (QID) | ORAL | Status: DC | PRN

## 2023-07-10 NOTE — Plan of Care (Signed)

## 2023-07-10 NOTE — Progress Notes (Signed)
   07/10/23 0700  Peritoneal Catheter   Placement Date/Time: 05/17/22 1135   Serial / Lot #: [id number]  Expiration Date: 01/17/26  Procedural Verification: Medical records & consent reviewed;Relevant studies,results and images reviewed;Site marked with initials  Time out: Correct Patient;Correc...  Site Assessment Clean, Dry, Intact  Drainage Description None  Catheter status Deaccessed  Dressing Gauze/Drain sponge  Dressing Status Clean, Dry, Intact  Dressing Intervention New dressing  Completion  Treatment Status Complete  Initial Drain Volume 10  Average Dwell Time-Hour(s) 1  Average Dwell Time-Min(s) 30  Average Drain Time 23  Total Therapy Volume 02725  Total Therapy Time-Hour(s) 10  Total Therapy Time-Min(s) 54  Effluent Appearance Amber  Cell Count on Daytime Exchange N/A  Fluid Balance - CCPD  Total UF (- value on cycler, pt gain) 498 mL  Procedure Comments  Tolerated treatment well? Yes  Peritoneal Dialysis Comments tx completed  Education / Care Plan  Dialysis Education Provided Yes  Hand-off documentation  Hand-off Given Given to shift RN/LPN  Report given to (Full Name) Cindra Eves.

## 2023-07-10 NOTE — Progress Notes (Signed)
NEW ADMISSION NOTE New Admission Note:   Arrival Method: Patient arrived from 4E in w/c. Mental Orientation: Alert and oriented x4. Telemetry: 12M-19, NSR Assessment: Completed Skin: warm and dry. IV: R AC, IV ABT infusing. Pain: Denies any pain. Tubes: PD cath Safety Measures: Safety Fall Prevention Plan has been given, discussed and signed Admission: Completed [facility] Orientation: Patient has been orientated to the room, unit and staff.  Family: None at bedside.  Orders have been reviewed and implemented. Will continue to monitor the patient. Call light has been placed within reach and bed alarm has been activated.   Arvilla Meres, RN

## 2023-07-10 NOTE — Discharge Summary (Addendum)
Name: Joshua Mullins MRN: [id number] DOB: 12/23/1975 47 y.o. PCP: Tracey Harries, MD  Date of Admission: 07/07/2023  7:10 AM Date of Discharge: 07/10/2023 3:58 PM Attending Physician: Dr. Mayford Knife  Discharge Diagnosis: Principal Problem:   Peritonitis associated with peritoneal dialysis, initial encounter [facility])    Discharge Medications: Allergies as of 07/10/2023       Reactions   Iodinated Contrast Media Other (See Comments), Swelling   Other Reaction(s): Laryngeal Edema   Norvasc [amlodipine] Swelling   Shellfish Allergy Swelling   Zestril [lisinopril] Swelling        Medication List     STOP taking these medications    allopurinol 300 MG tablet Commonly known as: ZYLOPRIM       TAKE these medications    Acetaminophen Extra Strength 500 MG Tabs Take 2 tablets (1,000 mg total) by mouth every 6 (six) hours.   atorvastatin 10 MG tablet Commonly known as: LIPITOR Take 10 mg by mouth in the morning.   Auryxia 1 GM 210 MG(Fe) tablet Generic drug: ferric citrate Take 630 mg by mouth 3 (three) times daily with meals.   calcitRIOL 0.5 MCG capsule Commonly known as: ROCALTROL Take 2 capsules by mouth at bedtime.   furosemide 80 MG tablet Commonly known as: LASIX Take 80 mg by mouth at bedtime.   hydrALAZINE 50 MG tablet Commonly known as: APRESOLINE Take 50 mg by mouth 2 (two) times daily.   latanoprost 0.005 % ophthalmic solution Commonly known as: XALATAN Place 1 drop into both eyes at bedtime.   metoprolol succinate 100 MG 24 hr tablet Commonly known as: TOPROL-XL Take 100 mg by mouth in the morning.   oxyCODONE 5 MG immediate release tablet Commonly known as: Oxy IR/ROXICODONE Take 1 tablet (5 mg total) by mouth every 8 (eight) hours as needed for severe pain (pain score 7-10).   Xphozah 30 MG Tabs Generic drug: Tenapanor HCl (CKD) Take 1 tablet by mouth at bedtime.               Discharge Care Instructions  (From admission,  onward)           Start     Ordered   07/10/23 0000  Discharge wound care:       Comments: Clean skin near exit site with chloraprep swab sticks.  Starting at catheter, use circular pattern around exit site, moving towards outer edges of area covered by dressing.  Apply gentamicin cream to site once daily.  Cover with dry dressing. Follow up with PCP.   07/10/23 1337            Disposition and follow-up:   Joshua Mullins was discharged from [facility] in Stable condition.  At the hospital follow up visit please address:  1.  Follow-up:  Peritoneal dialysis catheter-associated peritonitis - Patient was started on IV vancomycin and IV ceftazidime on admission. Per inpatient nephrology team, patient will receive IP vancomycin only on discharge. PD staff at [facility] [phone]) will help coordinate.  Last dose of IV vancomycin 11/19 AM of 1250 mg. His next vancomycin level should be a random vancomycin level for 07/12/23. Will be re-dosed via intraperitoneal dosing at that time. This will be supervised by the peritoneal dialysis staff.   Pain management - Patient's abdominal pain improved with antibiotic treatment. Experienced side effects with dilaudid/ hydromorphone (preferred in ESRD patients), discharged with short course of oxycodone 5 mg for severe pain. Please follow up on pain  improvement/management; his pain was nearly resolved at discharge.  ESRD on daily PD  - Received PD during this hospitalization without issue, patient with supplies to continue daily PD. Sees Dr. Ronalee Belts at [facility], please ensure patient has scheduled regular follow-up.    HTN  - Intermittently hypertensive on day of discharge, resumed all home meds. No changes made to home medications. Check BP at follow up visit and make adjustments as needed.   Stable liver lesion - Liver lesion on MRI 2019, stable on CT on admission. No follow up  recommended at this time, patient already aware previously.    2.  Labs / imaging needed at time of follow-up:  - RFP - CBC  3.  Pending labs/ test needing follow-up: none  4.  Medication Changes  STOPPED  - Allopurinol (not renally dosed, not filled since 2022)   ADDED  - Oxycodone 5 mg q8H as needed for severe pain for 6 doses   MODIFIED  - n/a  Follow-up Appointments: - Please make an appointment with your PCP, Dr. Everlene Other, for a hospitalization follow up visit in the next 7-10 days by calling [phone].  - Please continue to follow up with your kidney doctor, Dr. Ronalee Belts.  Hospital Course by problem list: Peritoneal dialysis catheter-associated peritonitis Patient with long term use of peritoneal dialysis presened to the ED after 24 hours of abdominal pain, cloudy fluid from PD catheter, nausea, and vomiting.  Patient remained afebrile and normotensive on admission. No leukocytosis. Blood cultures were obtained and he was started on vancomycin and ceftazidime. PD catheter fluid analysis with 22K cells, 83% neutrophils.  Peritoneal fluid cultures remain negative during admission.  Serial peritoneal fluid analysis showed significant decrease in cell count.  His pain improved significantly and he was discharged with IV vancomycin for 2-week course that will be dosed based on vancomycin levels.  His last dose of vancomycin was 07/10/2023 at 10 AM and his next random vancomycin levels planned for 07/12/2023.  Discussed this with peritoneal dialysis and will fax them this discharge summary as well.   #ESRD on PD daily Hx of ESRD on home PD daily since Oct 2023. Last successful dialysis on 7/14. Has LUE fistula that is patent but has never been used.  Follows up with Dr Ronalee Belts of [facility]. No prior episode of peritonitis in the past. Received PD during this hospitalization without issue.  #HTN Initially held his antihypertensives on admission but resumed them  during admission and he was normotensive on discharge.  #Anemia of ESRD Baseline in 8-10 range.with a slight downtrend on admission but iron labs consistent with anemia of chronic disease and there was no source of bleeding.  Hemoglobin stabilized at baseline.  S/p Aranesp on 11/17.  HLD Continued on Lipitor 10 mg.  Type 2 DM Last A1c 5.4 24 April 2023. Patient is off all diabetic medications.  Gout Did not resume allopurinol on admission.  Pharmacy fill history shows he was on this in 2022 but not more recently than that.  Not continued on discharge either.  #Liver Lesion  CT showed Benign-appearing peripherally calcified lesion in segment 4A of the liver, incompletely characterized, but similar in size to prior abdominal MRI 04/30/2018, presumably benign (potentially related to remote trauma).  Patient aware of this prior to hospitalization.  No follow-up needed.   Discharge Subjective: Patient with improved abdominal pain today. Reports intermittent pain around the lateral left abdominal area but not too bothersome. Denied any nausea with PO dilaudid  but felt as though it made his heart race, would prefer other pain medication. Endorsed good PO intake, no issues with voiding (still making urine). Denies CP, SOB, heart palpitations, headache, or other complaint at this time. Discussed plan to transition from IV antibiotic to PD antibiotic and follow up.   Discharge Exam:   Blood pressure (!) 141/83, pulse 89, temperature 98.3 F (36.8 C), temperature source Oral, resp. rate 20, height 5\' 10"  (1.778 m), weight 106.9 kg, SpO2 98%.  Constitutional: Well-appearing, sitting comfortably in chair at bedside, in no acute distress.  HENT: normocephalic atraumatic, mucous membranes moist. Cardiovascular: Regular rate and rhythm, no m/r/g. Pulmonary/Chest: Normal work of breathing on room air, lungs clear to auscultation bilaterally.  Abdominal: Soft, non-tender, non-distended. PD insertion site  clean and dry. Neurological: Alert and oriented, asking and answering questions appropriately.  MSK: No gross abnormalities. No lower extremity edema. Skin: Warm and dry.  Psych: Normal mood and affect.  Pertinent Labs, Studies, and Procedures:     Latest Ref Rng & Units 07/10/2023    5:29 AM 07/09/2023   10:26 AM 07/08/2023    4:15 AM  CBC  WBC 4.0 - 10.5 K/uL 7.8  8.7  8.7   Hemoglobin 13.0 - 17.0 g/dL 8.5  9.0  7.9   Hematocrit 39.0 - 52.0 % 25.7  27.2  24.4   Platelets 150 - 400 K/uL 231  231  195        Latest Ref Rng & Units 07/10/2023    5:29 AM 07/09/2023    6:07 AM 07/08/2023    4:15 AM  CMP  Glucose 70 - 99 mg/dL 409  811  914   BUN 6 - 20 mg/dL 63  76  95   Creatinine 0.61 - 1.24 mg/dL 78.29  56.21  30.86   Sodium 135 - 145 mmol/L 132  133  134   Potassium 3.5 - 5.1 mmol/L 3.5  4.2  4.5   Chloride 98 - 111 mmol/L 93  93  97   CO2 22 - 32 mmol/L 25  21  19    Calcium 8.9 - 10.3 mg/dL 7.3  7.7  7.0     CT ABDOMEN PELVIS WO CONTRAST  Result Date: 07/07/2023 CLINICAL DATA:  47 year old male with history of acute onset of nonlocalized abdominal pain. EXAM: CT ABDOMEN AND PELVIS WITHOUT CONTRAST TECHNIQUE: Multidetector CT imaging of the abdomen and pelvis was performed following the standard protocol without IV contrast. RADIATION DOSE REDUCTION: This exam was performed according to the departmental dose-optimization program which includes automated exposure control, adjustment of the mA and/or kV according to patient size and/or use of iterative reconstruction technique. COMPARISON:  No priors. FINDINGS: Lower chest: Unremarkable. Hepatobiliary: Centrally low-attenuation peripherally calcified lesion in segment 4A of the liver (axial image 15 of series 3) measuring 3.6 x 2.9 cm, incompletely characterized on today's noncontrast CT examination, but strongly favored to be benign (potentially related to remote trauma). No other definite suspicious hepatic lesions are noted.  Unenhanced appearance of the gallbladder is unremarkable. Pancreas: No definite pancreatic mass or peripancreatic fluid collections or inflammatory changes are noted on today's noncontrast CT examination. Spleen: Unremarkable. Adrenals/Urinary Tract: Unenhanced appearance of the kidneys and bilateral adrenal glands is normal. No hydroureteronephrosis. Urinary bladder is unremarkable in appearance. Stomach/Bowel: Unenhanced appearance of the stomach is normal. No pathologic dilatation of small bowel or colon. Numerous colonic diverticuli are noted, without surrounding inflammatory changes to indicate an acute diverticulitis at this time. Normal caliber appendix  containing some appendicoliths, without definite focal surrounding inflammatory changes. Vascular/Lymphatic: Atherosclerotic calcifications in the abdominal aorta and pelvic vasculature. No definite lymphadenopathy noted in the abdomen or pelvis. Reproductive: Prostate gland and seminal vesicles are unremarkable in appearance. Other: Tenckhoff catheter coiled in the low anatomic pelvis. Small volume of fluid in the peritoneal cavity, presumably peritoneal dialysate. Trace volume of pneumoperitoneum, presumably iatrogenic. Musculoskeletal: There are no aggressive appearing lytic or blastic lesions noted in the visualized portions of the skeleton. IMPRESSION: 1. No definite acute findings confidently identified in the abdomen or pelvis to account for the patient's symptoms. Tenckhoff dialysis catheter appears appropriately located. Small volume of dialysate and trace amount of pneumoperitoneum, presumably iatrogenic. 2. The appendix is normal in caliber and contains multiple appendicoliths. No definite focal surrounding inflammatory changes are confidently identified (assessment is slightly limited by presence of peritoneal dialysate). 3. Benign-appearing peripherally calcified lesion in segment 4A of the liver, incompletely characterized, but similar in size to  prior abdominal MRI 04/30/2018, presumably benign (potentially related to remote trauma). 4. Aortic atherosclerosis. Electronically Signed   By: Trudie Reed M.D.   On: 07/07/2023 10:08   DG Chest 2 View  Result Date: 07/07/2023 CLINICAL DATA:  Sepsis EXAM: CHEST - 2 VIEW COMPARISON:  08/02/2006 FINDINGS: Normal heart size and mediastinal contours. No acute infiltrate or edema. No effusion or pneumothorax. No acute osseous findings. IMPRESSION: Negative chest. Electronically Signed   By: Tiburcio Pea M.D.   On: 07/07/2023 07:57    Discharge Instructions: Discharge Instructions     Call MD for:   Complete by: As directed    Call MD for:  difficulty breathing, headache or visual disturbances   Complete by: As directed    Call MD for:  extreme fatigue   Complete by: As directed    Call MD for:  hives   Complete by: As directed    Call MD for:  persistant dizziness or light-headedness   Complete by: As directed    Call MD for:  persistant nausea and vomiting   Complete by: As directed    Call MD for:  redness, tenderness, or signs of infection (pain, swelling, redness, odor or green/yellow discharge around incision site)   Complete by: As directed    Call MD for:  severe uncontrolled pain   Complete by: As directed    Call MD for:  temperature >100.4   Complete by: As directed    Diet - low sodium heart healthy   Complete by: As directed    Discharge instructions   Complete by: As directed    Patient instructions:  - You were seen for bacterial peritonitis associated with your peritoneal dialysis. You were treated with IV antibiotics. You have been receiving peritoneal dialysis in the hospital, managed by the kidney doctors. Your pain seems to be improved, but we will provide you with a short course of pain medication to take as needed at home.  - You will be contacted by [facility] to help set up your antibiotic in the outpatient setting.  - Please continue to  take your home medications, we did not make any changes.  - Please be sure to set up an appointment with your primary care doctor, Dr. Everlene Other, for hospitalization follow up within the next 7-10 days.  - Please continue to follow up with your kidney doctor, Dr. Ronalee Belts.  We are so glad you are feeling better. It was a pleasure serving you during your stay. - Dr. Daryel Gerald and  the Internal Medicine Teaching Service at [facility]   Discharge wound care:   Complete by: As directed    Clean skin near exit site with chloraprep swab sticks.  Starting at catheter, use circular pattern around exit site, moving towards outer edges of area covered by dressing.  Apply gentamicin cream to site once daily.  Cover with dry dressing. Follow up with PCP.   Increase activity slowly   Complete by: As directed       Signed: Preslynn Bier Colbert Coyer, MD [facility] - PGY1 Pager: [phone] 07/10/2023, 3:58 PM    Please contact the on call pager after 5 pm and on weekends at [phone].

## 2023-07-10 NOTE — TOC Transition Note (Addendum)
Transition of Care [facility]) - CM/SW Discharge Note   Patient Details  Name: Joshua Mullins MRN: [id number] Date of Birth: [birth year]  Transition of [facility]) CM/SW Contact:  Tom-Johnson, Hershal Coria, RN Phone Number: 07/10/2023, 4:18 PM   Clinical Narrative:     Patient is scheduled for discharge today.  Discharge instructions on AVS. Prescriptions sent to [facility] pharmacy and meds will be delivered to patient at bedside prior discharge. No PT/OT f/u and no TOC needs or recommendations noted. Wife, Bjorn Loser to transport at discharge.  No further TOC needs noted.        Final next level of care: Home/Self Care Barriers to Discharge: Barriers Resolved   Patient Goals and CMS Choice CMS Medicare.gov Compare Post Acute Care list provided to:: Patient Choice offered to / list presented to : NA  Discharge Placement                  Patient to be transferred to facility by: Wife Name of family member notified: Rhonda    Discharge Plan and Services Additional resources added to the After Visit Summary for                  DME Arranged: N/A DME Agency: NA       HH Arranged: NA HH Agency: NA        Social Determinants of Health (SDOH) Interventions SDOH Screenings   Food Insecurity: No Food Insecurity (07/07/2023)  Housing: Low Risk  (07/07/2023)  Transportation Needs: No Transportation Needs (07/07/2023)  Utilities: Not At Risk (07/07/2023)  Financial Resource Strain: Low Risk  (06/07/2023)   Received from [facility]  Physical Activity: Unknown (06/07/2023)   Received from [facility]  Social Connections: Socially Integrated (06/07/2023)   Received from [facility]  Stress: No Stress Concern Present (06/07/2023)   Received from [facility]  Tobacco Use: Low Risk  (07/07/2023)     Readmission Risk Interventions    07/10/2023    4:16 PM  Readmission Risk Prevention Plan  Transportation Screening Complete  PCP or Specialist Appt within  5-7 Days Complete  Home Care Screening Complete  Medication Review (RN CM) Referral to Pharmacy

## 2023-07-10 NOTE — Progress Notes (Signed)
Subjective: Seen in room. Pain better, on po pain meds and eating. For dc today.   Objective Vital signs in last 24 hours: Vitals:   07/10/23 0034 07/10/23 0603 07/10/23 0807 07/10/23 0949  BP: (!) 153/88 135/78 135/82 (!) 141/83  Pulse: 79 83 88 89  Resp: 19 20 12 20   Temp: 98.3 F (36.8 C) 98.3 F (36.8 C) 98.6 F (37 C) 98.3 F (36.8 C)  TempSrc: Oral Oral Oral Oral  SpO2: 97% 99%  98%  Weight:  106.9 kg    Height:       Weight change: 1.942 kg  Physical Exam: General: Alert adult male NAD Heart: RRR no MRG Lungs: CTA bilaterally nonlabored breathing room air Abdomen: soft , ntnd Extremities: Trace pedal edema bilaterally Dialysis Access: PD catheter left quadrant dressing dry clean LFA AVF+bruit but minimally developed  Renal-related home meds: - auryxia 3 ac - rocaltrol 0.5 mcg take 2 at bedtime - phoslo 3 ac tid - gabapentin 100 hs - hydralazine 50 bid - toprol xl 100mg  qam - sod bicarb 1 hs      OP PD: [facility] Dr Ronalee Belts  5 exchanges, 3 L fill, 1.5 hr dwell, dry wt 105kg   - uses mostly 2.5's and some 1.5s - missed 1 session earlier this week , and last night had 1/5 exchanges     Problem/Plan: PD cath-related peritonitis  - w/ wbc count 22,000 initially. Started on empiric IV fortaz and IV vanc. Cell count yesterday was down to 336 and pain is improving. PD fluid cultures are negative to date. Okay for dc today w/ pain under control. For culture negative peritonitis we recommend 2 wks of Vancomycin. The Elita Quick can be dc'd. After dc home he will be getting the vancomycin intraperitoneal (IP) for the rest of the 2 week course.  ESKD - on PD started Oct 2023. Has L forearm AVF never used but is not very well matured.  Had PD here w/ no issues and creat improved from 22 to 15. Cont usual PD at home.  HTN - BP's up a bit. Home meds prn per pmd.  Volume - mild vol overload on exam. 2kg up, stable clinically.  Anemia of eskd - Hb 8- 10 range, follow, transfuse  prn.  Hb was down to 7.5, so we started low-dose ESA w/ darbe  60 mcg sq weekly on Sundays.  MBD ckd - CCa on the low side. Cont po vdra./phos 8.8.  Cont Auryxia  Dispo - for [employer] today   Rob Karolyn Messing  MD  [facility] 07/10/2023, 2:59 PM  Recent Labs  Lab 07/09/23 0607 07/09/23 1026 07/10/23 0529  HGB  --  9.0* 8.5*  ALBUMIN 3.1*  --  2.7*  CALCIUM 7.7*  --  7.3*  PHOS 7.2*  --  7.0*  CREATININE 16.96*  --  15.83*  K 4.2  --  3.5    Inpatient medications:  acetaminophen  1,000 mg Oral Q6H   atorvastatin  10 mg Oral q AM   calcitRIOL  1 mcg Oral QPM   [START ON 07/15/2023] darbepoetin (ARANESP) injection - DIALYSIS  60 mcg Subcutaneous Q Sun-1800   ferric citrate  630 mg Oral TID WC   furosemide  80 mg Oral Daily   gentamicin cream  1 Application Topical Daily   heparin  5,000 Units Subcutaneous Q8H   hydrALAZINE  50 mg Oral BID   latanoprost  1 drop Both Eyes QHS   metoprolol tartrate  100 mg  Oral Daily   scopolamine  1 patch Transdermal Q72H   sodium chloride flush  3 mL Intravenous Q12H   vancomycin variable dose per unstable renal function (pharmacist dosing)   Does not apply See admin instructions    cefTAZidime (FORTAZ)  IV 0.5 g (07/10/23 1308)   dialysis solution 2.5% low-MG/low-CA     heparin sodium (porcine) 3,000 Units in dialysis solution 2.5% low-MG/low-CA 6,000 mL dialysis solution, oxyCODONE, senna-docusate, trimethobenzamide

## 2023-07-10 NOTE — Discharge Instructions (Signed)
Patient instructions:  You were seen for bacterial peritonitis associated with your peritoneal dialysis. You were treated with IV antibiotics. You have been receiving peritoneal dialysis in the hospital, managed by the kidney doctors. Your pain seems to be improved, but we will provide you with a short course of pain medication to take as needed at home.  You will be contacted by [facility] to help set up your antibiotic in the outpatient setting.  Please continue to take your home medications, we did not make any changes.  Please be sure to set up an appointment with your primary care doctor, Dr. Everlene Other, for hospitalization follow up within the next 7-10 days.  Please continue to follow up with your kidney doctor, Dr. Ronalee Belts.   We are so glad you are feeling better. It was a pleasure serving you during your stay. - Dr. Daryel Gerald and the Internal Medicine Teaching Service at [facility]

## 2023-07-10 NOTE — Progress Notes (Signed)
Pharmacy Antibiotic Note  Joshua Mullins is a 47 y.o. male admitted on 07/07/2023 with  peritoneal catheter related peritonitis .  Pharmacy has been consulted for vancomycin dosing.  Plan: Vancomycin is  currently being administered intravenously. Random vancomycin level collected at 0529h 07/10/23 resulted as 17 micrograms/mL.   Dose = (Cp-Ccurrent) X Vd          =  (30 - 17) x Vd (0.9 x 106.9)           = 13 x 96.21          = 1250 milligrams of vancomycin today x 1 dose; will check a random vancomycin level ~0500h on 07/12/23 for use in transitioning to IP vancomycin if still indicated.             Height: 5\' 10"  (177.8 cm) Weight: 106.9 kg (235 lb 9.6 oz) IBW/kg (Calculated) : 73  Temp (24hrs), Avg:98.3 F (36.8 C), Min:98.1 F (36.7 C), Max:98.6 F (37 C)  Recent Labs  Lab 07/07/23 0738 07/07/23 0745 07/07/23 1028 07/08/23 0415 07/09/23 0607 07/09/23 1026 07/09/23 1233 07/10/23 0529  WBC 7.9  --   --  8.7  --  8.7  --  7.8  CREATININE 23.60*  --   --  22.44* 16.96*  --   --  15.83*  LATICACIDVEN  --  1.1 0.7  --   --   --   --   --   VANCORANDOM  --   --   --   --   --   --  22 17    Estimated Creatinine Clearance: 7.1 mL/min (A) (by C-G formula based on SCr of 15.83 mg/dL (H)).    Allergies  Allergen Reactions   Iodinated Contrast Media Other (See Comments) and Swelling    Other Reaction(s): Laryngeal Edema   Norvasc [Amlodipine] Swelling   Shellfish Allergy Swelling   Zestril [Lisinopril] Swelling    Antimicrobials this admission: Vancomycin initiating dose administered on 16-NOV-24 Ceftazidime initiating dose administered on 16-NOV-24 and then every 24 hours thereafter based on renal function with continued monitoring and assessment of plans per Nephrology Service.   Dose adjustments this admission: See above.   Microbiology results: 11/16 Bcx: NGTD x 3 days 11/16 peritoneal fluid: NG, 11/17 peritoneal fluid appearance cited as clear.   Thank you  for allowing pharmacy to be a part of this patient's care.  Elicia Lamp, PharmD, CPP 07/10/2023 8:55 AM

## 2023-07-10 NOTE — Progress Notes (Signed)
Joshua Mullins to be discharged Home per MD order. Discussed prescriptions and follow up appointments with the patient. Prescriptions and medication list explained in detail. Patient verbalized understanding.  Skin clean, dry and intact without evidence of skin break down, no evidence of skin tears noted. IV catheter discontinued intact. Site without signs and symptoms of complications. Dressing and pressure applied. Pt denies pain at the site currently. No complaints noted.  Patient free of lines, drains, and wounds.   An After Visit Summary (AVS) was printed and given to the patient. Patient escorted via wheelchair, and discharged home via private auto.  Arvilla Meres, RN

## 2023-07-11 LAB — BODY FLUID CULTURE W GRAM STAIN: Culture: NO GROWTH

## 2023-07-12 ENCOUNTER — Other Ambulatory Visit
Admission: RE | Admit: 2023-07-12 | Discharge: 2023-07-12 | Disposition: A | Discharge status: Home / Self Care | Payer: BC Managed Care – PPO | Source: Ambulatory Visit | Attending: Nephrology | Admitting: Nephrology

## 2023-07-12 DIAGNOSIS — K650 Generalized (acute) peritonitis: Secondary | ICD-10-CM | POA: Insufficient documentation

## 2023-07-12 LAB — CULTURE, BLOOD (ROUTINE X 2)
Culture: NO GROWTH
Special Requests: ADEQUATE

## 2023-07-13 LAB — VANCOMYCIN, TROUGH: Vancomycin Tr: 28 ug/mL (ref 15–20)

## 2023-07-16 ENCOUNTER — Other Ambulatory Visit
Admission: RE | Admit: 2023-07-16 | Discharge: 2023-07-16 | Disposition: A | Discharge status: Home / Self Care | Payer: BC Managed Care – PPO | Source: Ambulatory Visit | Attending: Nephrology | Admitting: Nephrology

## 2023-07-16 DIAGNOSIS — K650 Generalized (acute) peritonitis: Secondary | ICD-10-CM | POA: Insufficient documentation

## 2023-07-16 LAB — VANCOMYCIN, TROUGH: Vancomycin Tr: 27 ug/mL (ref 15–20)

## 2023-07-20 ENCOUNTER — Other Ambulatory Visit
Admission: RE | Admit: 2023-07-20 | Discharge: 2023-07-20 | Disposition: A | Discharge status: Home / Self Care | Payer: BC Managed Care – PPO | Source: Ambulatory Visit | Attending: Nephrology | Admitting: Nephrology

## 2023-07-20 DIAGNOSIS — K650 Generalized (acute) peritonitis: Secondary | ICD-10-CM | POA: Diagnosis present

## 2023-07-20 LAB — VANCOMYCIN, TROUGH: Vancomycin Tr: 19 ug/mL (ref 15–20)

## 2024-03-06 ENCOUNTER — Ambulatory Visit (INDEPENDENT_AMBULATORY_CARE_PROVIDER_SITE_OTHER): Admitting: Podiatry

## 2024-03-06 DIAGNOSIS — M79674 Pain in right toe(s): Secondary | ICD-10-CM | POA: Diagnosis not present

## 2024-03-06 DIAGNOSIS — B351 Tinea unguium: Secondary | ICD-10-CM

## 2024-03-06 DIAGNOSIS — S90822A Blister (nonthermal), left foot, initial encounter: Secondary | ICD-10-CM

## 2024-03-06 DIAGNOSIS — M79675 Pain in left toe(s): Secondary | ICD-10-CM | POA: Diagnosis not present

## 2024-03-06 DIAGNOSIS — T148XXA Other injury of unspecified body region, initial encounter: Secondary | ICD-10-CM

## 2024-03-06 MED ORDER — CICLOPIROX 8 % EX SOLN: Freq: Every day | CUTANEOUS | 2 refills | Status: DC

## 2024-03-06 NOTE — Patient Instructions (Signed)
 Ciclopirox Topical Solution What is this medication? CICLOPIROX (sye kloe PEER ox) treats fungal infections of the nails. It belongs to a group of medications called antifungals. It will not treat infections caused by bacteria or viruses. This medicine may be used for other purposes; ask your health care provider or pharmacist if you have questions. COMMON BRAND NAME(S): Ciclodan Nail Solution, CNL8, Penlac What should I tell my care team before I take this medication? They need to know if you have any of these conditions: Diabetes (high blood sugar) Immune system problems Organ transplant Receiving steroid inhalers, cream, or lotion Seizures Tingling of the fingers or toes or other nerve disorder An unusual or allergic reaction to ciclopirox, other medications, foods, dyes, or preservatives Pregnant or trying to get pregnant Breast-feeding How should I use this medication? This medication is for external use only. Do not take by mouth. Wash your hands before and after use. If you are treating your hands, only wash your hands before use. Do not get it in your eyes. If you do, rinse your eyes with plenty of cool tap water. Use it as directed on the prescription label at the same time every day. Do not use it more often than directed. Use the medication for the full course as directed by your care team, even if you think you are better. Do not stop using it unless your care team tells you to stop it early. Apply a thin film of the medication to the affected area. Talk to your care team about the use of this medication in children. While it may be prescribed for children as young as 12 years for selected conditions, precautions do apply. Overdosage: If you think you have taken too much of this medicine contact a poison control center or emergency room at once. NOTE: This medicine is only for you. Do not share this medicine with others. What if I miss a dose? If you miss a dose, use it as soon as  you can. If it is almost time for your next dose, use only that dose. Do not use double or extra doses. What may interact with this medication? Interactions are not expected. Do not use any other skin products without telling your care team. This list may not describe all possible interactions. Give your health care provider a list of all the medicines, herbs, non-prescription drugs, or dietary supplements you use. Also tell them if you smoke, drink alcohol, or use illegal drugs. Some items may interact with your medicine. What should I watch for while using this medication? Visit your care team for regular checks on your progress. It may be some time before you see the benefit from this medication. Do not use nail polish or other nail cosmetic products on the treated nails. Removal of the unattached, infected nail by your care team is needed with use of this medication. If you have diabetes or numbness in your fingers or toes, talk to your care team about proper nail care. What side effects may I notice from receiving this medication? Side effects that you should report to your care team as soon as possible: Allergic reactions--skin rash, itching, hives, swelling of the face, lips, tongue, or throat Burning, itching, crusting, or peeling of treated skin Side effects that usually do not require medical attention (report to your care team if they continue or are bothersome): Change in nail shape, thickness, or color Mild skin irritation, redness, or dryness This list may not describe all possible side  effects. Call your doctor for medical advice about side effects. You may report side effects to FDA at 1-800-FDA-1088. Where should I keep my medication? Keep out of the reach of children and pets. Store at room temperature between 20 and 25 degrees C (68 and 77 degrees F). This medication is flammable. Avoid exposure to heat, fire, flame, and smoking. Get rid of medications that are no longer needed  or have expired: Take the medication to a medication take-back program. Check with your pharmacy or law enforcement to find a location. If you cannot return the medication, check the label or package insert to see if the medication should be thrown out in the garbage or flushed down the toilet. If you are not sure, ask your care team. If it is safe to put in the trash, take the medication out of the container. Mix the medication with cat litter, dirt, coffee grounds, or other unwanted substance. Seal the mixture in a bag or container. Put it in the trash. NOTE: This sheet is a summary. It may not cover all possible information. If you have questions about this medicine, talk to your doctor, pharmacist, or health care provider.  2024 Elsevier/Gold Standard (2021-12-05 00:00:00)

## 2024-03-06 NOTE — Progress Notes (Signed)
 Subjective:   Patient ID: Joshua Mullins, male   DOB: 48 y.o.   MRN: [id number]   HPI Chief Complaint  Patient presents with   Nail Problem    Rm14 thick and yellow toenails hard to cut/dialysis/not diabetic/68 year   48 year old male presents the office with above concerns.  He states the nails are thick and discolored and he has difficulty trimming them.  He does not report any open lesions.  On HD  A1c 5.6 on 02/12/2024  No ulcers    Review of Systems  All other systems reviewed and are negative.  Past Medical History:  Diagnosis Date   Diabetes mellitus without complication (HCC)    Type II   ESRD on peritoneal dialysis (HCC)    Hyperlipidemia    Hypertension    Sleep apnea    Uses a cpap    Past Surgical History:  Procedure Laterality Date   AV FISTULA PLACEMENT Left 07/21/2021   Procedure: LEFT ARM FISTULA CREATION;  Surgeon: Serene Gaile ORN, MD;  Location: [facility] OR;  Service: Vascular;  Laterality: Left;   AV FISTULA PLACEMENT Left 01/05/2022   Procedure: ARTERIOVENOUS (AV) FISTULA REVISION AND ELEVATION LEFT ARM;  Surgeon: Serene Gaile ORN, MD;  Location: [facility] OR;  Service: Vascular;  Laterality: Left;   CAPD INSERTION N/A 05/17/2022   Procedure: LAPAROSCOPIC INSERTION CONTINUOUS AMBULATORY PERITONEAL DIALYSIS  (CAPD) CATHETER;  Surgeon: Serene Gaile ORN, MD;  Location: [facility] OR;  Service: Vascular;  Laterality: N/A;     Current Outpatient Medications:    acetaminophen  (TYLENOL ) 500 MG tablet, Take 2 tablets (1,000 mg total) by mouth every 6 (six) hours., Disp: 30 tablet, Rfl: 0   AURYXIA  1 GM 210 MG(Fe) tablet, Take 630 mg by mouth 3 (three) times daily with meals., Disp: , Rfl:    calcitRIOL  (ROCALTROL ) 0.5 MCG capsule, Take 2 capsules by mouth at bedtime., Disp: , Rfl:    ciclopirox  (PENLAC ) 8 % solution, Apply topically at bedtime. Apply over nail and surrounding skin. Apply daily over previous coat. After seven (7) days, may remove with alcohol and continue cycle.,  Disp: 6.6 mL, Rfl: 2   furosemide  (LASIX ) 80 MG tablet, Take 80 mg by mouth at bedtime., Disp: , Rfl:    hydrALAZINE  (APRESOLINE ) 50 MG tablet, Take 50 mg by mouth 2 (two) times daily., Disp: , Rfl:    latanoprost  (XALATAN ) 0.005 % ophthalmic solution, Place 1 drop into both eyes at bedtime., Disp: , Rfl:    metoprolol  succinate (TOPROL -XL) 100 MG 24 hr tablet, Take 100 mg by mouth in the morning., Disp: , Rfl:    XPHOZAH 30 MG TABS, Take 1 tablet by mouth at bedtime., Disp: , Rfl:    atorvastatin  (LIPITOR) 10 MG tablet, Take 10 mg by mouth in the morning. (Patient not taking: Reported on 03/06/2024), Disp: , Rfl:    oxyCODONE  (OXY IR/ROXICODONE ) 5 MG immediate release tablet, Take 1 tablet (5 mg total) by mouth every 8 (eight) hours as needed for severe pain (pain score 7-10). (Patient not taking: Reported on 03/06/2024), Disp: 6 tablet, Rfl: 0  Allergies  Allergen Reactions   Iodinated Contrast Media Other (See Comments) and Swelling    Other Reaction(s): Laryngeal Edema   Norvasc [Amlodipine] Swelling   Shellfish Allergy Swelling   Zestril [Lisinopril] Swelling           Objective:  Physical Exam  General: AAO x3, NAD  Dermatological:Nails are hypertrophic, dystrophic, brittle, discolored, elongated 10. No surrounding redness or drainage.  Tenderness nails 1-5 bilaterally.  1 small superficial dried hemorrhagic blister noted left foot.  There is no surrounding erythema, ascending cellulitis there is no drainage or pus or any signs of infection.  She is otherwise.   Vascular: Dorsalis Pedis artery and Posterior Tibial artery pedal pulses are 2/4 bilateral with immedate capillary fill time. There is no pain with calf compression, swelling, warmth, erythema.   Neruologic: Sensation decreased with Semmes Weinstein Monofilament.   Musculoskeletal: No other areas of discomfort.  Muscular strength 5/5 in all groups tested bilateral.  Gait: Unassisted, Nonantalgic.       Assessment:    48 year old male with symptomatic onychomycosis     Plan:  -Treatment options discussed including all alternatives, risks, and complications -Etiology of symptoms were discussed - Nails sharply debrided x 10 without any locations or bleeding.  He would like treatment for the nail fungus.  Prescribed Penlac .  Discussed side effects, success rate as well as duration. -Monitor the left blister.  Keep clean and monitor any signs or symptoms of infection.  If not resolved in next couple weeks call me now or sooner if there is any worsening. -Daily foot inspection.   Return in about 3 months (around 06/06/2024) for nail trim/fungus.  [name] DPM

## 2024-06-06 ENCOUNTER — Ambulatory Visit: Admitting: Podiatry

## 2024-06-09 ENCOUNTER — Ambulatory Visit: Admitting: Podiatry

## 2024-06-10 ENCOUNTER — Ambulatory Visit: Admitting: Podiatry

## 2024-06-10 DIAGNOSIS — M79675 Pain in left toe(s): Secondary | ICD-10-CM | POA: Diagnosis not present

## 2024-06-10 DIAGNOSIS — B351 Tinea unguium: Secondary | ICD-10-CM

## 2024-06-10 DIAGNOSIS — M79674 Pain in right toe(s): Secondary | ICD-10-CM

## 2024-06-10 MED ORDER — CICLOPIROX 8 % EX SOLN: Freq: Every day | CUTANEOUS | 2 refills | Status: AC

## 2024-06-10 NOTE — Progress Notes (Unsigned)
 Subjective:   Patient ID: Joshua Mullins, male   DOB: 48 y.o.   MRN: [id number]   HPI Chief Complaint  Patient presents with   RFC    Rm63     48 year old male presents the office with above concerns.  He states the nails are thick and discolored and he has difficulty trimming them.  He does not report any open lesions.  He is still been using Penlac  which has been helping.  Does not report any ulcerations or other concerns today.  On HD  A1c 6.0 on 04/16/2024      Objective:  Physical Exam  General: AAO x3, NAD  Dermatological:Nails are hypertrophic, dystrophic, brittle, discolored, elongated 10. No surrounding redness or drainage. Tenderness nails 1-5 bilaterally.  There is clearing on the proximal nail fold and overall the color to the toenails look improved.  There are no open lesions or blisters today.  Vascular: Dorsalis Pedis artery and Posterior Tibial artery pedal pulses are 2/4 bilateral with immedate capillary fill time. There is no pain with calf compression, swelling, warmth, erythema.   Neruologic: Sensation decreased with Semmes Weinstein Monofilament.   Musculoskeletal: No other areas of discomfort.  Muscular strength 5/5 in all groups tested bilateral.  Gait: Unassisted, Nonantalgic.       Assessment:   48 year old male with symptomatic onychomycosis     Plan:  -Treatment options discussed including all alternatives, risks, and complications -Etiology of symptoms were discussed - Nails sharply debrided x 10 without any locations or bleeding.  Continue Penlac . -Discussed daily foot inspection.  Return in about 3 months (around 09/10/2024) for nail fungus/trim.  [name] DPM

## 2024-06-16 ENCOUNTER — Observation Stay (HOSPITAL_COMMUNITY)
Admission: EM | Admit: 2024-06-16 | Discharge: 2024-06-17 | Disposition: A | Discharge status: Home / Self Care | Attending: Internal Medicine | Admitting: Internal Medicine

## 2024-06-16 ENCOUNTER — Other Ambulatory Visit: Payer: Self-pay

## 2024-06-16 ENCOUNTER — Emergency Department (HOSPITAL_COMMUNITY)

## 2024-06-16 ENCOUNTER — Encounter (HOSPITAL_COMMUNITY): Payer: Self-pay | Admitting: Emergency Medicine

## 2024-06-16 DIAGNOSIS — G4730 Sleep apnea, unspecified: Secondary | ICD-10-CM | POA: Diagnosis not present

## 2024-06-16 DIAGNOSIS — E1122 Type 2 diabetes mellitus with diabetic chronic kidney disease: Secondary | ICD-10-CM | POA: Diagnosis not present

## 2024-06-16 DIAGNOSIS — Z79899 Other long term (current) drug therapy: Secondary | ICD-10-CM | POA: Insufficient documentation

## 2024-06-16 DIAGNOSIS — N186 End stage renal disease: Secondary | ICD-10-CM | POA: Diagnosis not present

## 2024-06-16 DIAGNOSIS — R0789 Other chest pain: Principal | ICD-10-CM

## 2024-06-16 DIAGNOSIS — D62 Acute posthemorrhagic anemia: Secondary | ICD-10-CM | POA: Diagnosis not present

## 2024-06-16 DIAGNOSIS — I120 Hypertensive chronic kidney disease with stage 5 chronic kidney disease or end stage renal disease: Secondary | ICD-10-CM | POA: Diagnosis not present

## 2024-06-16 DIAGNOSIS — R079 Chest pain, unspecified: Secondary | ICD-10-CM | POA: Diagnosis present

## 2024-06-16 DIAGNOSIS — G4733 Obstructive sleep apnea (adult) (pediatric): Secondary | ICD-10-CM

## 2024-06-16 DIAGNOSIS — E785 Hyperlipidemia, unspecified: Secondary | ICD-10-CM | POA: Diagnosis not present

## 2024-06-16 DIAGNOSIS — D539 Nutritional anemia, unspecified: Secondary | ICD-10-CM | POA: Insufficient documentation

## 2024-06-16 DIAGNOSIS — Z992 Dependence on renal dialysis: Secondary | ICD-10-CM

## 2024-06-16 DIAGNOSIS — E1169 Type 2 diabetes mellitus with other specified complication: Secondary | ICD-10-CM | POA: Diagnosis present

## 2024-06-16 DIAGNOSIS — R7989 Other specified abnormal findings of blood chemistry: Secondary | ICD-10-CM | POA: Diagnosis not present

## 2024-06-16 DIAGNOSIS — I10 Essential (primary) hypertension: Secondary | ICD-10-CM | POA: Diagnosis present

## 2024-06-16 LAB — BASIC METABOLIC PANEL WITH GFR
Anion gap: 17 — ABNORMAL HIGH (ref 5–15)
BUN: 92 mg/dL — ABNORMAL HIGH (ref 6–20)
CO2: 23 mmol/L (ref 22–32)
Calcium: 7.4 mg/dL — ABNORMAL LOW (ref 8.9–10.3)
Chloride: 100 mmol/L (ref 98–111)
Creatinine, Ser: 20.25 mg/dL — ABNORMAL HIGH (ref 0.61–1.24)
GFR, Estimated: 3 mL/min — ABNORMAL LOW (ref >60)
Glucose, Bld: 84 mg/dL (ref 70–99)
Potassium: 4.6 mmol/L (ref 3.5–5.1)
Sodium: 140 mmol/L (ref 135–145)

## 2024-06-16 LAB — CBC
HCT: 22.3 % — ABNORMAL LOW (ref 39.0–52.0)
Hemoglobin: 7 g/dL — ABNORMAL LOW (ref 13.0–17.0)
MCH: 32.6 pg (ref 26.0–34.0)
MCHC: 31.4 g/dL (ref 30.0–36.0)
MCV: 103.7 fL — ABNORMAL HIGH (ref 80.0–100.0)
Platelets: 245 K/uL (ref 150–400)
RBC: 2.15 MIL/uL — ABNORMAL LOW (ref 4.22–5.81)
RDW: 13.8 % (ref 11.5–15.5)
WBC: 8.4 K/uL (ref 4.0–10.5)
nRBC: 0 % (ref 0.0–0.2)

## 2024-06-16 LAB — TROPONIN I (HIGH SENSITIVITY)
Troponin I (High Sensitivity): 13 ng/L (ref <18)
Troponin I (High Sensitivity): 13 ng/L (ref <18)

## 2024-06-16 MED ORDER — METHYLPREDNISOLONE SODIUM SUCC 40 MG IJ SOLR
40.0000 mg | Freq: Once | INTRAMUSCULAR | Status: AC
  Administered 2024-06-17: 40 mg via INTRAVENOUS
  Filled 2024-06-16: qty 1

## 2024-06-16 MED ORDER — MORPHINE SULFATE (PF) 4 MG/ML IV SOLN
4.0000 mg | Freq: Once | INTRAVENOUS | Status: AC
  Administered 2024-06-17: 4 mg via INTRAVENOUS
  Filled 2024-06-16: qty 1

## 2024-06-16 MED ORDER — DIPHENHYDRAMINE HCL 50 MG/ML IJ SOLN: 50.0000 mg | Freq: Once | INTRAMUSCULAR | Status: DC

## 2024-06-16 MED ORDER — DIPHENHYDRAMINE HCL 25 MG PO CAPS: 50.0000 mg | ORAL_CAPSULE | Freq: Once | ORAL | Status: DC

## 2024-06-16 NOTE — ED Provider Notes (Signed)
 [place] EMERGENCY DEPARTMENT AT [facility] Provider Note   CSN: [id number] Arrival date & time: 06/16/24  1627     Patient presents with: No chief complaint on file.   Joshua Mullins is a 48 y.o. male.  {Add pertinent medical, surgical, social history, OB history to YEP:67052} Patient with a history of ESRD on peritoneal dialysis, diabetes, hypertension here with right-sided chest pain.  Symptoms started 4 to 5 days ago.  Pain feels like a sharp pressure to the right side of his chest and mid sternum that radiates to his upper back.  Waxes and wanes in severity but never goes away completely.  Worse with deep breathing, movement, coughing.  Denies any fall or injury.  No fever, chills, nausea, vomiting.  No shortness of breath.  No abdominal pain, fever or vomiting.  No issues with his dialysis catheter.  Does make some urine.  Has been on dialysis for 3 years.  Denies any history of PE or DVT.  No cardiac history.  Had reassuring stress test last year.  Took Tums at home without relief.  No history of GERD or acid reflux.  The history is provided by the patient and the spouse.       Prior to Admission medications   Medication Sig Start Date End Date Taking? Authorizing Provider  acetaminophen  (TYLENOL ) 500 MG tablet Take 2 tablets (1,000 mg total) by mouth every 6 (six) hours. 07/10/23   Jolaine Pac, DO  atorvastatin  (LIPITOR) 10 MG tablet Take 10 mg by mouth in the morning. Patient not taking: Reported on 03/06/2024 11/22/20   [provider]  AURYXIA  1 GM 210 MG(Fe) tablet Take 630 mg by mouth 3 (three) times daily with meals. 08/01/22   [provider]  calcitRIOL  (ROCALTROL ) 0.5 MCG capsule Take 2 capsules by mouth at bedtime. 02/13/23   [provider]  ciclopirox  (PENLAC ) 8 % solution Apply topically at bedtime. Apply over nail and surrounding skin. Apply daily over previous coat. After seven (7) days, may remove with alcohol and  continue cycle. 06/10/24   Gershon Donnice SAUNDERS, DPM  furosemide  (LASIX ) 80 MG tablet Take 80 mg by mouth at bedtime. 10/03/22   [provider]  hydrALAZINE  (APRESOLINE ) 50 MG tablet Take 50 mg by mouth 2 (two) times daily. 11/29/21   [provider]  latanoprost  (XALATAN ) 0.005 % ophthalmic solution Place 1 drop into both eyes at bedtime. 12/07/20   [provider]  metoprolol  succinate (TOPROL -XL) 100 MG 24 hr tablet Take 100 mg by mouth in the morning. 12/17/20   [provider]  oxyCODONE  (OXY IR/ROXICODONE ) 5 MG immediate release tablet Take 1 tablet (5 mg total) by mouth every 8 (eight) hours as needed for severe pain (pain score 7-10). Patient not taking: Reported on 03/06/2024 07/10/23   Jolaine Pac, DO  XPHOZAH 30 MG TABS Take 1 tablet by mouth at bedtime. 04/06/23   [provider]    Allergies: Iodinated contrast media, Norvasc [amlodipine], Shellfish allergy, and Zestril [lisinopril]    Review of Systems  Constitutional:  Negative for activity change, appetite change and fever.  HENT:  Negative for congestion and rhinorrhea.   Respiratory:  Positive for chest tightness. Negative for shortness of breath.   Cardiovascular:  Positive for chest pain.  Gastrointestinal:  Negative for abdominal pain, nausea and vomiting.  Genitourinary:  Negative for dysuria and hematuria.  Musculoskeletal:  Negative for arthralgias and myalgias.  Skin:  Negative for rash.  Neurological:  Negative for dizziness, weakness and headaches.   all other systems are negative except as noted in the HPI and PMH.    Updated Vital Signs BP (!) 147/101   Pulse 89   Temp 98 F (36.7 C)   Resp 16   SpO2 99%   Physical Exam Vitals and nursing note reviewed.  Constitutional:      General: He is not in acute distress.    Appearance: He is well-developed.  HENT:     Head: Normocephalic and atraumatic.     Mouth/Throat:     Pharynx: No oropharyngeal exudate.   Eyes:     Conjunctiva/sclera: Conjunctivae normal.     Pupils: Pupils are equal, round, and reactive to light.  Neck:     Comments: No meningismus. Cardiovascular:     Rate and Rhythm: Normal rate and regular rhythm.     Heart sounds: Normal heart sounds. No murmur heard. Pulmonary:     Effort: Pulmonary effort is normal. No respiratory distress.     Breath sounds: Normal breath sounds.     Comments: Tender to right chest, no rash Chest:     Chest wall: Tenderness present.  Abdominal:     Palpations: Abdomen is soft.     Tenderness: There is no abdominal tenderness. There is no guarding or rebound.  Musculoskeletal:        General: No tenderness. Normal range of motion.     Cervical back: Normal range of motion and neck supple.  Skin:    General: Skin is warm.  Neurological:     General: No focal deficit present.     Mental Status: He is alert and oriented to person, place, and time. Mental status is at baseline.     Cranial Nerves: No cranial nerve deficit.     Motor: No abnormal muscle tone.     Coordination: Coordination normal.     Comments:  5/5 strength throughout. CN 2-12 intact.Equal grip strength.   Psychiatric:        Behavior: Behavior normal.     (all labs ordered are listed, but only abnormal results are displayed) Labs Reviewed  BASIC METABOLIC PANEL WITH GFR - Abnormal; Notable for the following components:      Result Value   BUN 92 (*)    Creatinine, Ser 20.25 (*)    Calcium  7.4 (*)    GFR, Estimated 3 (*)    Anion gap 17 (*)    All other components within normal limits  CBC - Abnormal; Notable for the following components:   RBC 2.15 (*)    Hemoglobin 7.0 (*)    HCT 22.3 (*)    MCV 103.7 (*)    All other components within normal limits  HEMOGLOBIN AND HEMATOCRIT, BLOOD  D-DIMER, QUANTITATIVE  POC OCCULT BLOOD, ED  TYPE AND SCREEN  TROPONIN I (HIGH SENSITIVITY)  TROPONIN I (HIGH SENSITIVITY)    EKG: EKG Interpretation Date/Time:  Monday  June 16 2024 23:33:22 EDT Ventricular Rate:  93 PR Interval:  160 QRS Duration:  101 QT Interval:  396 QTC Calculation: 493 R Axis:   92  Text Interpretation: Sinus rhythm Borderline right axis deviation Borderline prolonged QT interval No significant change was found Confirmed by Carita Senior [id number]) on 06/16/2024 11:40:34 PM  Radiology: [facility] Chest 1 View Result Date: 06/16/2024 CLINICAL DATA:  Right-sided chest pain. EXAM: CHEST  1 VIEW COMPARISON:  July 07, 2023 FINDINGS: The heart size and mediastinal contours are within normal limits. Both lungs  are clear. The visualized skeletal structures are unremarkable. IMPRESSION: No active disease. Electronically Signed   By: Suzen Dials M.D.   On: 06/16/2024 18:58    {Document cardiac monitor, telemetry assessment procedure when appropriate:32947} Procedures   Medications Ordered in the ED  methylPREDNISolone sodium succinate (SOLU-MEDROL) 40 mg/mL injection 40 mg (has no administration in time range)  diphenhydrAMINE (BENADRYL) capsule 50 mg (has no administration in time range)    Or  diphenhydrAMINE (BENADRYL) injection 50 mg (has no administration in time range)  morphine  (PF) 4 MG/ML injection 4 mg (has no administration in time range)      {Click here for ABCD2, HEART and other calculators REFRESH Note before signing:1}                              Medical Decision Making Amount and/or Complexity of Data Reviewed Independent Historian: spouse Labs: ordered. Decision-making details documented in ED Course. Radiology: ordered and independent interpretation performed. Decision-making details documented in ED Course. ECG/medicine tests: ordered and independent interpretation performed. Decision-making details documented in ED Course.  Risk Prescription drug management.   ESRD peritoneal dialysis here with 4 days of constant right-sided chest pain that waxes and wanes in severity.  Pain is somewhat worse with  palpation, deep breathing and movement.  EKG is sinus rhythm without acute ST changes.  Labs show worsening anemia 7.0 from 8-9.  Denies any black or bloody stools.  Troponin negative x 2.  Chest x-ray negative without pneumonia or pneumothorax. Did have reassuring stress test in September 2024.  Pain seems atypical for ACS and negative troponin with ongoing pain for several days is reassuring. However it is somewhat pleuritic.  He is not tachycardic or tachypneic however.  Will screen with D-dimer.  Does have anaphylaxis allergy to IV contrast, prefer to avoid it.  May also have contribution from worsening anemia.  {Document critical care time when appropriate  Document review of labs and clinical decision tools ie CHADS2VASC2, etc  Document your independent review of radiology images and any outside records  Document your discussion with family members, caretakers and with consultants  Document social determinants of health affecting pt's care  Document your decision making why or why not admission, treatments were needed:32947:::1}   Final diagnoses:  None    ED Discharge Orders     None

## 2024-06-16 NOTE — ED Provider Triage Note (Signed)
 Emergency Medicine Provider Triage Evaluation Note  Joshua Mullins , a 48 y.o. male  was evaluated in triage.  Pt complains of cp. Endorse pain to chest hurts with cough and with movement ongoing x 4 days. No fever, chills, n/v/d, lighthead or dizzy.  No hx of PE/DVT  Review of Systems  Positive: As above Negative: As above  Physical Exam  BP (!) 143/86 (BP Location: Right Arm)   Pulse 84   Temp 98 F (36.7 C)   Resp 14   SpO2 99%  Gen:   Awake, no distress   Resp:  Normal effort  MSK:   Moves extremities without difficulty  Other:    Medical Decision Making  Medically screening exam initiated at 5:59 PM.  Appropriate orders placed.  Joshua Mullins was informed that the remainder of the evaluation will be completed by another provider, this initial triage assessment does not replace that evaluation, and the importance of remaining in the ED until their evaluation is complete.     Nivia Colon, PA-C 06/16/24 1800

## 2024-06-16 NOTE — ED Notes (Signed)
 CCMD called.

## 2024-06-16 NOTE — ED Triage Notes (Signed)
 Pt states CP is worse with movement.  Centralized radiating to right side when present.

## 2024-06-16 NOTE — ED Triage Notes (Signed)
 Patient has chest pain on the right side of his chest last Thursday. Pain does not radiate. He reports that he is in kidney failure and he takes peritoneal dialysis.

## 2024-06-17 ENCOUNTER — Observation Stay (HOSPITAL_COMMUNITY)

## 2024-06-17 ENCOUNTER — Other Ambulatory Visit (HOSPITAL_COMMUNITY): Payer: Self-pay

## 2024-06-17 ENCOUNTER — Encounter (HOSPITAL_COMMUNITY): Payer: Self-pay | Admitting: Internal Medicine

## 2024-06-17 DIAGNOSIS — Z992 Dependence on renal dialysis: Secondary | ICD-10-CM

## 2024-06-17 DIAGNOSIS — I10 Essential (primary) hypertension: Secondary | ICD-10-CM | POA: Diagnosis not present

## 2024-06-17 DIAGNOSIS — R079 Chest pain, unspecified: Secondary | ICD-10-CM

## 2024-06-17 DIAGNOSIS — D539 Nutritional anemia, unspecified: Secondary | ICD-10-CM | POA: Insufficient documentation

## 2024-06-17 LAB — PREPARE RBC (CROSSMATCH)

## 2024-06-17 LAB — COMPREHENSIVE METABOLIC PANEL WITH GFR
ALT: 16 U/L (ref 0–44)
AST: 11 U/L — ABNORMAL LOW (ref 15–41)
Albumin: 3 g/dL — ABNORMAL LOW (ref 3.5–5.0)
Alkaline Phosphatase: 52 U/L (ref 38–126)
Anion gap: 18 — ABNORMAL HIGH (ref 5–15)
BUN: 96 mg/dL — ABNORMAL HIGH (ref 6–20)
CO2: 23 mmol/L (ref 22–32)
Calcium: 7.2 mg/dL — ABNORMAL LOW (ref 8.9–10.3)
Chloride: 98 mmol/L (ref 98–111)
Creatinine, Ser: 20.52 mg/dL — ABNORMAL HIGH (ref 0.61–1.24)
GFR, Estimated: 2 mL/min — ABNORMAL LOW (ref >60)
Glucose, Bld: 93 mg/dL (ref 70–99)
Potassium: 4.9 mmol/L (ref 3.5–5.1)
Sodium: 139 mmol/L (ref 135–145)
Total Bilirubin: 0.8 mg/dL (ref 0.0–1.2)
Total Protein: 6.3 g/dL — ABNORMAL LOW (ref 6.5–8.1)

## 2024-06-17 LAB — RETICULOCYTES
Immature Retic Fract: 11.7 % (ref 2.3–15.9)
RBC.: 2.06 MIL/uL — ABNORMAL LOW (ref 4.22–5.81)
Retic Count, Absolute: 33.2 K/uL (ref 19.0–186.0)
Retic Ct Pct: 1.6 % (ref 0.4–3.1)

## 2024-06-17 LAB — ECHOCARDIOGRAM COMPLETE
AR max vel: 1.82 cm2
AV Area VTI: 1.71 cm2
AV Area mean vel: 1.74 cm2
AV Mean grad: 8 mmHg
AV Peak grad: 13.8 mmHg
Ao pk vel: 1.86 m/s
Calc EF: 77 %
Est EF: >75
Height: 70 in
S' Lateral: 2.8 cm
Single Plane A2C EF: 71.9 %
Single Plane A4C EF: 79.9 %
Weight: 3536 [oz_av]

## 2024-06-17 LAB — CBC
HCT: 21.4 % — ABNORMAL LOW (ref 39.0–52.0)
Hemoglobin: 6.9 g/dL — CL (ref 13.0–17.0)
MCH: 33.5 pg (ref 26.0–34.0)
MCHC: 32.2 g/dL (ref 30.0–36.0)
MCV: 103.9 fL — ABNORMAL HIGH (ref 80.0–100.0)
Platelets: 251 K/uL (ref 150–400)
RBC: 2.06 MIL/uL — ABNORMAL LOW (ref 4.22–5.81)
RDW: 13.9 % (ref 11.5–15.5)
WBC: 10.5 K/uL (ref 4.0–10.5)
nRBC: 0 % (ref 0.0–0.2)

## 2024-06-17 LAB — FOLATE: Folate: 7.5 ng/mL (ref >5.9)

## 2024-06-17 LAB — IRON AND TIBC
Iron: 107 ug/dL (ref 45–182)
Saturation Ratios: 49 % — ABNORMAL HIGH (ref 17.9–39.5)
TIBC: 220 ug/dL — ABNORMAL LOW (ref 250–450)
UIBC: 113 ug/dL

## 2024-06-17 LAB — CBG MONITORING, ED
Glucose-Capillary: 132 mg/dL — ABNORMAL HIGH (ref 70–99)
Glucose-Capillary: 155 mg/dL — ABNORMAL HIGH (ref 70–99)

## 2024-06-17 LAB — D-DIMER, QUANTITATIVE: D-Dimer, Quant: 1.41 ug{FEU}/mL — ABNORMAL HIGH (ref 0.00–0.50)

## 2024-06-17 LAB — VITAMIN B12: Vitamin B-12: 1218 pg/mL — ABNORMAL HIGH (ref 180–914)

## 2024-06-17 LAB — POC OCCULT BLOOD, ED: Fecal Occult Bld: NEGATIVE

## 2024-06-17 LAB — ABO/RH: ABO/RH(D): O POS

## 2024-06-17 LAB — FERRITIN: Ferritin: 805 ng/mL — ABNORMAL HIGH (ref 24–336)

## 2024-06-17 LAB — HEMOGLOBIN AND HEMATOCRIT, BLOOD
HCT: 22.3 % — ABNORMAL LOW (ref 39.0–52.0)
Hemoglobin: 7.2 g/dL — ABNORMAL LOW (ref 13.0–17.0)

## 2024-06-17 MED ORDER — ACETAMINOPHEN 650 MG RE SUPP: 650.0000 mg | Freq: Four times a day (QID) | RECTAL | Status: DC | PRN

## 2024-06-17 MED ORDER — FUROSEMIDE 20 MG PO TABS: 80.0000 mg | ORAL_TABLET | Freq: Every day | ORAL | Status: DC

## 2024-06-17 MED ORDER — SODIUM CHLORIDE 0.9% IV SOLUTION: Freq: Once | INTRAVENOUS | Status: AC

## 2024-06-17 MED ORDER — METOPROLOL SUCCINATE ER 100 MG PO TB24
100.0000 mg | ORAL_TABLET | Freq: Every day | ORAL | Status: DC
  Administered 2024-06-17: 100 mg via ORAL
  Filled 2024-06-17: qty 1

## 2024-06-17 MED ORDER — LATANOPROST 0.005 % OP SOLN: 1.0000 [drp] | Freq: Every day | OPHTHALMIC | Status: DC

## 2024-06-17 MED ORDER — HEPARIN SODIUM (PORCINE) 5000 UNIT/ML IJ SOLN
5000.0000 [IU] | Freq: Three times a day (TID) | INTRAMUSCULAR | Status: DC
  Administered 2024-06-17: 5000 [IU] via SUBCUTANEOUS
  Filled 2024-06-17: qty 1

## 2024-06-17 MED ORDER — HYDRALAZINE HCL 50 MG PO TABS: 50.0000 mg | ORAL_TABLET | Freq: Two times a day (BID) | ORAL | Status: DC

## 2024-06-17 MED ORDER — HYDRALAZINE HCL 25 MG PO TABS
50.0000 mg | ORAL_TABLET | Freq: Once | ORAL | Status: AC
  Administered 2024-06-17: 50 mg via ORAL
  Filled 2024-06-17: qty 2

## 2024-06-17 MED ORDER — ACETAMINOPHEN 325 MG PO TABS: 650.0000 mg | ORAL_TABLET | Freq: Four times a day (QID) | ORAL | Status: DC | PRN

## 2024-06-17 MED ORDER — LIDOCAINE 4 % EX PTCH
1.0000 | MEDICATED_PATCH | Freq: Every day | CUTANEOUS | 0 refills | Status: AC | PRN
  Filled 2024-06-17: qty 14, 14d supply, fill #0

## 2024-06-17 MED ORDER — OXYCODONE HCL 5 MG PO TABS
5.0000 mg | ORAL_TABLET | Freq: Three times a day (TID) | ORAL | 0 refills | Status: AC | PRN
  Filled 2024-06-17: qty 15, 5d supply, fill #0

## 2024-06-17 MED ORDER — TECHNETIUM TO 99M ALBUMIN AGGREGATED
4.0000 | Freq: Once | INTRAVENOUS | Status: AC
  Administered 2024-06-17: 4 via INTRAVENOUS

## 2024-06-17 MED ORDER — LABETALOL HCL 5 MG/ML IV SOLN
5.0000 mg | INTRAVENOUS | Status: DC | PRN
  Administered 2024-06-17: 5 mg via INTRAVENOUS
  Filled 2024-06-17: qty 4

## 2024-06-17 NOTE — ED Notes (Signed)
Patient transported to [facility].

## 2024-06-17 NOTE — ED Notes (Addendum)
 Patient had inquired about having his peritoneal dialysis done, as he has not had it in over 24 hours and usually does it daily. I messaged attending Dr. Arlice regarding this, and he stated that he would see patient when he returns from [facility] and would consult nephrology at that time

## 2024-06-17 NOTE — ED Notes (Signed)
 CCMD called to report patient's discharge status.

## 2024-06-17 NOTE — Discharge Summary (Signed)
 Physician Discharge Summary  Joshua Mullins FMW:[id number] DOB: [birth year] DOA: 06/16/2024  PCP: Pura Lenis, MD  Admit date: 06/16/2024 Discharge date: 06/17/2024  Admitted from: home Discharge disposition: Home  Recommendations at discharge:  Encourage compliance with nocturnal PD For chest wall pain, recommend extra strength Tylenol , lidocaine  patch and oxycodone  as needed    Subjective: Patient was seen and examined this afternoon.  Propped up in bed.  Not in distress.  Pain control is better. VQ scan negative for PE. Remains afebrile, hemodynamically stable, breathing on room air Labs from this morning with hemoglobin low at 6.9  Brief narrative: Joshua Mullins is a 48 y.o. male with PMH significant for ESRD on PD for the last 3 years, DM2, HTN, HLD, chronic anemia 10/27, patient presented to the ED with complaint of chest pain, right-sided pleuritic type for the last 4 to 5 days, worse with deep inspiration and movement of right upper extremity.  Denies any history of trauma.  In the ED, patient was afebrile, hemodynamically stable, Breathing on room air Chest x-ray unremarkable Troponin negative.  EKG normal sinus rhythm D-dimer mildly elevated CT angio chest could not be done because of history of allergy to contrast dye. PET scan was planned and hence patient was kept in observation to [facility] Also, hemoglobin low at 7.  FOBT negative  Assessment and plan: Chest pain Elevated D-dimer Right-sided, pleuritic, worse with deep inspiration and movement of right upper extremity Given elevated D-dimer and inability to CT angio chest, VQ scan was obtained which came negative for PE.   Pain is likely musculoskeletal in etiology.  Extra strength Tylenol , lidocaine  patch and oxycodone  as needed recommended.  ESRD-PD Patient missed PD last night while in the ED.  Does not look uremic this afternoon.  No urgent need of dialysis today.  To resume PD tonight at  home.  Uncontrolled hypertension In the setting of ESRD. PTA meds Toprol  100 mg in the morning, hydralazine  50 mg twice daily, Lasix  80 mg nightly. Continue all  HLD Continue Lipitor  Acute on chronic anemia  Hemoglobin at baseline between 8 and 9. Hemoglobin low at 6.9 today.  FOBT negative.  Ferritin level adequate.  Likely anemia of ESRD.  Would benefit from Aranesp  1 unit PRBC transfusion given today.  I have asked him to monitor for any obvious bleeding or black stool. Recent Labs    07/09/23 0607 07/09/23 1026 07/10/23 0529 06/16/24 1759 06/17/24 0023 06/17/24 0257  HGB  --  9.0* 8.5* 7.0* 7.2* 6.9*  MCV  --  94.8 93.5 103.7*  --  103.9*  VITAMINB12  --   --   --   --   --  1,218*  FOLATE  --   --   --   --   --  7.5  FERRITIN 709*  --   --   --   --  805*  TIBC 217*  --   --   --   --  220*  IRON  42*  --   --   --   --  107  RETICCTPCT  --   --   --   --   --  1.6   Type 2 diabetes mellitus A1c 6 two months ago. diet controlled Continue SSI/Accu-Cheks Recent Labs  Lab 06/17/24 0832 06/17/24 1118  GLUCAP 132* 155*   OSA Nocturnal CPAP continue  Goals of care   Code Status: Full Code   Diet:  Diet Order  Diet renal/carb modified with fluid restriction Diet-HS Snack? Nothing; Fluid restriction: 1200 mL Fluid; Room service appropriate? Yes; Fluid consistency: Thin  Diet effective now           Diet general                   Nutritional status:  Body mass index is 31.71 kg/m.       Wounds:  -    Discharge Medications:   Allergies as of 06/17/2024       Reactions   Iodinated Contrast Media Other (See Comments), Swelling   Other Reaction(s): Laryngeal Edema   Shellfish Allergy Shortness Of Breath, Swelling   Norvasc [amlodipine] Swelling   Lower extremities    Zestril [lisinopril] Swelling   Facial swelling        Medication List     TAKE these medications    Acetaminophen  Extra Strength 500 MG Tabs Take 2  tablets (1,000 mg total) by mouth every 6 (six) hours.   atorvastatin  10 MG tablet Commonly known as: LIPITOR Take 10 mg by mouth in the morning.   Auryxia  1 GM 210 MG(Fe) tablet Generic drug: ferric citrate  Take 630 mg by mouth 3 (three) times daily with meals.   calcitRIOL  0.25 MCG capsule Commonly known as: ROCALTROL  Take 0.25 mcg by mouth at bedtime.   ciclopirox  8 % solution Commonly known as: PENLAC  Apply topically at bedtime. Apply over nail and surrounding skin. Apply daily over previous coat. After seven (7) days, may remove with alcohol and continue cycle.   furosemide  80 MG tablet Commonly known as: LASIX  Take 80 mg by mouth at bedtime.   hydrALAZINE  50 MG tablet Commonly known as: APRESOLINE  Take 50 mg by mouth 2 (two) times daily.   latanoprost  0.005 % ophthalmic solution Commonly known as: XALATAN  Place 1 drop into both eyes at bedtime.   lidocaine  4 % Place 1 patch onto the skin daily as needed for up to 14 days (pain).   metoprolol  succinate 100 MG 24 hr tablet Commonly known as: TOPROL -XL Take 100 mg by mouth in the morning.   oxyCODONE  5 MG immediate release tablet Commonly known as: Roxicodone  Take 1 tablet (5 mg total) by mouth every 8 (eight) hours as needed for up to 5 days.   Xphozah 30 MG Tabs Generic drug: Tenapanor HCl (CKD) Take 1 tablet by mouth at bedtime.         Follow ups:    Follow-up Information     Pura Lenis, MD Follow up.   Specialty: Family Medicine Contact information: [address] [place] [address] [phone]                 Discharge Instructions:   Discharge Instructions     Call MD for:  difficulty breathing, headache or visual disturbances   Complete by: As directed    Call MD for:  extreme fatigue   Complete by: As directed    Call MD for:  hives   Complete by: As directed    Call MD for:  persistant dizziness or light-headedness   Complete by: As directed     Call MD for:  persistant nausea and vomiting   Complete by: As directed    Call MD for:  severe uncontrolled pain   Complete by: As directed    Call MD for:  temperature >100.4   Complete by: As directed    Diet general   Complete by: As directed  Discharge instructions   Complete by: As directed    Recommendations at discharge:   Encourage compliance with nocturnal PD  For chest wall pain, recommend extra strength Tylenol , lidocaine  patch and oxycodone  as needed  PDMP reviewed this encounter.   Opioid taper instructions: It is important to wean off of your opioid medication as soon as possible. If you do not need pain medication after your surgery it is ok to stop day one. Opioids include: Codeine, Hydrocodone(Norco, Vicodin), Oxycodone (Percocet, oxycontin ) and hydromorphone  amongst others.  Long term and even short term use of opiods can cause: Increased pain response Dependence Constipation Depression Respiratory depression And more.  Withdrawal symptoms can include Flu like symptoms Nausea, vomiting And more Techniques to manage these symptoms Hydrate well Eat regular healthy meals Stay active Use relaxation techniques(deep breathing, meditating, yoga) Do Not substitute Alcohol to help with tapering If you have been on opioids for less than two weeks and do not have pain than it is ok to stop all together.  Plan to wean off of opioids This plan should start within one week post op of your joint replacement. Maintain the same interval or time between taking each dose and first decrease the dose.  Cut the total daily intake of opioids by one tablet each day Next start to increase the time between doses. The last dose that should be eliminated is the evening dose.        General discharge instructions: Follow with Primary MD Pura Lenis, MD in 7 days  Please request your PCP  to go over your hospital tests, procedures, radiology results at the follow up.  Please get your medicines reviewed and adjusted.  Your PCP may decide to repeat certain labs or tests as needed. Do not drive, operate heavy machinery, perform activities at heights, swimming or participation in water activities or provide baby sitting services if your were admitted for syncope or siezures until you have seen by Primary MD or a Neurologist and advised to do so again. [place]  Controlled Substance Reporting System database was reviewed. Do not drive, operate heavy machinery, perform activities at heights, swim, participate in water activities or provide baby-sitting services while on medications for pain, sleep and mood until your outpatient physician has reevaluated you and advised to do so again.  You are strongly recommended to comply with the dose, frequency and duration of prescribed medications. Activity: As tolerated with Full fall precautions use walker/cane & assistance as needed Avoid using any recreational substances like cigarette, tobacco, alcohol, or non-prescribed drug. If you experience worsening of your admission symptoms, develop shortness of breath, life threatening emergency, suicidal or homicidal thoughts you must seek medical attention immediately by calling 911 or calling your MD immediately  if symptoms less severe. You must read complete instructions/literature along with all the possible adverse reactions/side effects for all the medicines you take and that have been prescribed to you. Take any new medicine only after you have completely understood and accepted all the possible adverse reactions/side effects.  Wear Seat belts while driving. You were cared for by a hospitalist during your hospital stay. If you have any questions about your discharge medications or the care you received while you were in the hospital after you are discharged, you can call the unit and ask to speak with the hospitalist or the covering physician. Once you are discharged, your  primary care physician will handle any further medical issues. Please note that NO REFILLS for any discharge medications will be  authorized once you are discharged, as it is imperative that you return to your primary care physician (or establish a relationship with a primary care physician if you do not have one).   Increase activity slowly   Complete by: As directed        Discharge Exam:   Vitals:   06/17/24 1225 06/17/24 1345 06/17/24 1409 06/17/24 1434  BP:  (!) 149/79 (!) 148/74 (!) 160/89  Pulse: 99 98 98 96  Resp:  20 20 18   Temp:   98.3 F (36.8 C) 98.1 F (36.7 C)  TempSrc:   Oral Oral  SpO2: 98% 99% 98% 98%  Weight:      Height:        Body mass index is 31.71 kg/m.  General exam: Pleasant, middle-aged African-American male Skin: No rashes, lesions or ulcers. HEENT: Atraumatic, normocephalic, no obvious bleeding Lungs: Clear to auscultation bilaterally,  CVS: S1, S2, no murmur,   GI/Abd: Soft, nontender, nondistended, bowel sound present,   CNS: Alert, awake, oriented times 3 Psychiatry: Mood appropriate Extremities: No pedal edema, no calf tenderness,    The results of significant diagnostics from this hospitalization (including imaging, microbiology, ancillary and laboratory) are listed below for reference.    Procedures and Diagnostic Studies:   NM Pulmonary Perfusion Result Date: 06/17/2024 CLINICAL DATA:  Pulmonary embolism (PE) suspected, high prob EXAM: NUCLEAR MEDICINE PERFUSION LUNG SCAN TECHNIQUE: Perfusion images were obtained in multiple projections after intravenous injection of radiopharmaceutical. No ventilation imaging performed per standard protocol. RADIOPHARMACEUTICALS:  4.0 mCi Tc-56m MAA IV COMPARISON:  Chest radiographs 06/16/2024. Abdominopelvic CT 07/07/2023. FINDINGS: The pulmonary perfusion is within normal limits bilaterally. There are no wedge-shaped perfusion defects to suggest acute pulmonary embolism. IMPRESSION: No evidence of acute  pulmonary embolism on perfusion scintigraphy by PISAPED criteria. Electronically Signed   By: Elsie Perone M.D.   On: 06/17/2024 13:44   ECHOCARDIOGRAM COMPLETE Result Date: 06/17/2024    ECHOCARDIOGRAM REPORT   Patient Name:   Joshua Mullins Date of Exam: 06/17/2024 Medical Rec #:  [id number]          Height:       70.0 in Accession #:    [id number]         Weight:       221.0 lb Date of Birth:  07/10/1976           BSA:          2.178 m Patient Age:    48 years           BP:           128/89 mmHg Patient Gender: M                  HR:           87 bpm. Exam Location:  Inpatient Procedure: 2D Echo (Both Spectral and Color Flow Doppler were utilized during            procedure). Indications:    CP  History:        Patient has no prior history of Echocardiogram examinations.                 Signs/Symptoms:Chest Pain.  Sonographer:    Norleen Amour Referring Phys: REDIA LOISE CLEAVER IMPRESSIONS  1. Papillary muscle hypertrophy. Left ventricular ejection fraction, by estimation, is >75%. The left ventricle has hyperdynamic function. The left ventricle has no regional wall motion abnormalities. There is mild concentric left ventricular hypertrophy.  Left ventricular diastolic parameters were normal.  2. Right ventricular systolic function is hyperdynamic. The right ventricular size is normal.  3. Left atrial size was mild to moderately dilated.  4. The mitral valve is normal in structure. No evidence of mitral valve regurgitation. No evidence of mitral stenosis.  5. The aortic valve is tricuspid. Aortic valve regurgitation is not visualized. No aortic stenosis is present.  6. The inferior vena cava is normal in size with greater than 50% respiratory variability, suggesting right atrial pressure of 3 mmHg. Comparison(s): No prior Echocardiogram. FINDINGS  Left Ventricle: Papillary muscle hypertrophy. Left ventricular ejection fraction, by estimation, is >75%. The left ventricle has hyperdynamic function. The  left ventricle has no regional wall motion abnormalities. The left ventricular internal cavity size was normal in size. There is mild concentric left ventricular hypertrophy. Left ventricular diastolic parameters were normal. Right Ventricle: The right ventricular size is normal. Right vetricular wall thickness was not well visualized. Right ventricular systolic function is hyperdynamic. Left Atrium: Left atrial size was mild to moderately dilated. Right Atrium: Right atrial size was normal in size. Pericardium: There is no evidence of pericardial effusion. Mitral Valve: The mitral valve is normal in structure. No evidence of mitral valve regurgitation. No evidence of mitral valve stenosis. Tricuspid Valve: The tricuspid valve is normal in structure. Tricuspid valve regurgitation is mild . No evidence of tricuspid stenosis. Aortic Valve: The aortic valve is tricuspid. Aortic valve regurgitation is not visualized. No aortic stenosis is present. Aortic valve mean gradient measures 8.0 mmHg. Aortic valve peak gradient measures 13.8 mmHg. Aortic valve area, by VTI measures 1.71  cm. Pulmonic Valve: The pulmonic valve was normal in structure. Pulmonic valve regurgitation is not visualized. No evidence of pulmonic stenosis. Aorta: The aortic root and ascending aorta are structurally normal, with no evidence of dilitation. Venous: The inferior vena cava is normal in size with greater than 50% respiratory variability, suggesting right atrial pressure of 3 mmHg. IAS/Shunts: The atrial septum is grossly normal.  LEFT VENTRICLE PLAX 2D LVIDd:         5.10 cm      Diastology LVIDs:         2.80 cm      LV e' medial:  18.20 cm/s LV PW:         1.10 cm      LV e' lateral: 15.90 cm/s LV IVS:        1.10 cm LVOT diam:     2.00 cm LV SV:         63 LV SV Index:   29 LVOT Area:     3.14 cm LV IVRT:       66 msec  LV Volumes (MOD) LV vol d, MOD A2C: 114.0 ml LV vol d, MOD A4C: 142.0 ml LV vol s, MOD A2C: 32.0 ml LV vol s, MOD A4C:  28.5 ml LV SV MOD A2C:     82.0 ml LV SV MOD A4C:     142.0 ml LV SV MOD BP:      103.4 ml RIGHT VENTRICLE             IVC RV Basal diam:  3.80 cm     IVC diam: 1.20 cm RV S prime:     15.20 cm/s TAPSE (M-mode): 1.8 cm      PULMONARY VEINS  Diastolic Velocity: 43.00 cm/s                             S/D Velocity:       1.80                             Systolic Velocity:  78.80 cm/s LEFT ATRIUM             Index        RIGHT ATRIUM           Index LA diam:        3.30 cm 1.52 cm/m   RA Area:     14.70 cm LA Vol (A2C):   74.4 ml 34.16 ml/m  RA Volume:   37.00 ml  16.99 ml/m LA Vol (A4C):   95.9 ml 44.04 ml/m LA Biplane Vol: 92.2 ml 42.34 ml/m  AORTIC VALVE                     PULMONIC VALVE AV Area (Vmax):    1.82 cm      PV Vmax:       1.35 m/s AV Area (Vmean):   1.74 cm      PV Peak grad:  7.3 mmHg AV Area (VTI):     1.71 cm AV Vmax:           186.00 cm/s AV Vmean:          128.000 cm/s AV VTI:            0.372 m AV Peak Grad:      13.8 mmHg AV Mean Grad:      8.0 mmHg LVOT Vmax:         108.00 cm/s LVOT Vmean:        71.000 cm/s LVOT VTI:          0.202 m LVOT/AV VTI ratio: 0.54  AORTA Ao Root diam: 3.00 cm Ao Asc diam:  3.10 cm  SHUNTS Systemic VTI:  0.20 m Systemic Diam: 2.00 cm Stanly Leavens MD Electronically signed by Stanly Leavens MD Signature Date/Time: 06/17/2024/12:01:58 PM    Final    DG Chest 1 View Result Date: 06/16/2024 CLINICAL DATA:  Right-sided chest pain. EXAM: CHEST  1 VIEW COMPARISON:  July 07, 2023 FINDINGS: The heart size and mediastinal contours are within normal limits. Both lungs are clear. The visualized skeletal structures are unremarkable. IMPRESSION: No active disease. Electronically Signed   By: Suzen Dials M.D.   On: 06/16/2024 18:58     Labs:   Basic Metabolic Panel: Recent Labs  Lab 06/16/24 1759 06/17/24 0257  NA 140 139  K 4.6 4.9  CL 100 98  CO2 23 23  GLUCOSE 84 93  BUN 92* 96*  CREATININE 20.25*  20.52*  CALCIUM  7.4* 7.2*   GFR Estimated Creatinine Clearance: 5.3 mL/min (A) (by C-G formula based on SCr of 20.52 mg/dL (H)). Liver Function Tests: Recent Labs  Lab 06/17/24 0257  AST 11*  ALT 16  ALKPHOS 52  BILITOT 0.8  PROT 6.3*  ALBUMIN 3.0*   No results for input(s): LIPASE, AMYLASE in the last 168 hours. No results for input(s): AMMONIA in the last 168 hours. Coagulation profile No results for input(s): INR, PROTIME in the last 168 hours.  CBC: Recent Labs  Lab 06/16/24 1759 06/17/24 0023 06/17/24 0257  WBC 8.4  --  10.5  HGB 7.0* 7.2* 6.9*  HCT 22.3* 22.3* 21.4*  MCV 103.7*  --  103.9*  PLT 245  --  251   Cardiac Enzymes: No results for input(s): CKTOTAL, CKMB, CKMBINDEX, TROPONINI in the last 168 hours. BNP: Invalid input(s): POCBNP CBG: Recent Labs  Lab 06/17/24 0832 06/17/24 1118  GLUCAP 132* 155*   D-Dimer Recent Labs    06/17/24 0023  DDIMER 1.41*   Hgb A1c No results for input(s): HGBA1C in the last 72 hours. Lipid Profile No results for input(s): CHOL, HDL, LDLCALC, TRIG, CHOLHDL, LDLDIRECT in the last 72 hours. Thyroid function studies No results for input(s): TSH, T4TOTAL, T3FREE, THYROIDAB in the last 72 hours.  Invalid input(s): FREET3 Anemia work up Recent Labs    06/17/24 0257  VITAMINB12 1,218*  FOLATE 7.5  FERRITIN 805*  TIBC 220*  IRON  107  RETICCTPCT 1.6   Microbiology No results found for this or any previous visit (from the past 240 hours).  Time coordinating discharge: 45 minutes  Signed: Shalandra Leu  [facility] 06/17/2024, 3:46 PM

## 2024-06-17 NOTE — Progress Notes (Signed)
  Echocardiogram 2D Echocardiogram has been performed.  Norleen ORN [facility] 06/17/2024, 10:52 AM

## 2024-06-17 NOTE — H&P (Signed)
 History and Physical    Joshua Mullins FMW:[id number] DOB: June 17, 1976 DOA: 06/16/2024  Patient coming from: Home.  Chief Complaint: Chest pain.  HPI: Joshua Mullins is a 48 y.o. male with history of ESRD on peritoneal dialysis for last 3 years, hypertension, diabetes mellitus type 2, anemia secondary to ESRD presents to the ER with complaints of chest pain.  Patient has been having right-sided pleuritic type of chest pain for the last 4 to 5 days.  Pain increased on deep inspiration and also moving his right upper extremity.  Denies any trauma or lifting any weights.  Denies fever chills or productive cough.  ED Course: In the ER patient was afebrile.  Chest x-ray did not show anything acute.  Troponins were negative EKG was showing normal sinus rhythm.  D-dimer was mildly elevated but patient is having severe allergies to contrast dye so admitted for further observation and get VQ scan.  Labs show mildly decreased hemoglobin from baseline around 8 and it is around 7 today.  Fecal occult blood was negative.  Patient's systolic blood pressure was more than 180 was given his home dose of hydralazine .  Review of Systems: As per HPI, rest all negative.   Past Medical History:  Diagnosis Date   Diabetes mellitus without complication (HCC)    Type II   ESRD on peritoneal dialysis (HCC)    Hyperlipidemia    Hypertension    Sleep apnea    Uses a cpap    Past Surgical History:  Procedure Laterality Date   AV FISTULA PLACEMENT Left 07/21/2021   Procedure: LEFT ARM FISTULA CREATION;  Surgeon: Serene Gaile ORN, MD;  Location: [facility] OR;  Service: Vascular;  Laterality: Left;   AV FISTULA PLACEMENT Left 01/05/2022   Procedure: ARTERIOVENOUS (AV) FISTULA REVISION AND ELEVATION LEFT ARM;  Surgeon: Serene Gaile ORN, MD;  Location: [facility] OR;  Service: Vascular;  Laterality: Left;   CAPD INSERTION N/A 05/17/2022   Procedure: LAPAROSCOPIC INSERTION CONTINUOUS AMBULATORY PERITONEAL DIALYSIS  (CAPD)  CATHETER;  Surgeon: Serene Gaile ORN, MD;  Location: [facility] OR;  Service: Vascular;  Laterality: N/A;     reports that he has never smoked. He has never been exposed to tobacco smoke. He has never used smokeless tobacco. He reports that he does not drink alcohol and does not use drugs.  Allergies  Allergen Reactions   Iodinated Contrast Media Other (See Comments) and Swelling    Other Reaction(s): Laryngeal Edema   Norvasc [Amlodipine] Swelling   Shellfish Allergy Swelling   Zestril [Lisinopril] Swelling    History reviewed. No pertinent family history.  Prior to Admission medications   Medication Sig Start Date End Date Taking? Authorizing Provider  acetaminophen  (TYLENOL ) 500 MG tablet Take 2 tablets (1,000 mg total) by mouth every 6 (six) hours. 07/10/23   Jolaine Pac, DO  atorvastatin  (LIPITOR) 10 MG tablet Take 10 mg by mouth in the morning. Patient not taking: Reported on 03/06/2024 11/22/20   [provider]  AURYXIA  1 GM 210 MG(Fe) tablet Take 630 mg by mouth 3 (three) times daily with meals. 08/01/22   [provider]  calcitRIOL  (ROCALTROL ) 0.5 MCG capsule Take 2 capsules by mouth at bedtime. 02/13/23   [provider]  ciclopirox  (PENLAC ) 8 % solution Apply topically at bedtime. Apply over nail and surrounding skin. Apply daily over previous coat. After seven (7) days, may remove with alcohol and continue cycle. 06/10/24   Gershon Donnice SAUNDERS, DPM  furosemide  (LASIX ) 80 MG tablet Take  80 mg by mouth at bedtime. 10/03/22   [provider]  hydrALAZINE  (APRESOLINE ) 50 MG tablet Take 50 mg by mouth 2 (two) times daily. 11/29/21   [provider]  latanoprost  (XALATAN ) 0.005 % ophthalmic solution Place 1 drop into both eyes at bedtime. 12/07/20   [provider]  metoprolol  succinate (TOPROL -XL) 100 MG 24 hr tablet Take 100 mg by mouth in the morning. 12/17/20   [provider]  oxyCODONE  (OXY IR/ROXICODONE ) 5 MG immediate  release tablet Take 1 tablet (5 mg total) by mouth every 8 (eight) hours as needed for severe pain (pain score 7-10). Patient not taking: Reported on 03/06/2024 07/10/23   Jolaine Pac, DO  XPHOZAH 30 MG TABS Take 1 tablet by mouth at bedtime. 04/06/23   [provider]    Physical Exam: Constitutional: Moderately built and nourished. Vitals:   06/17/24 0034 06/17/24 0100 06/17/24 0110 06/17/24 0112  BP:  (!) 185/95 (!) 185/95   Pulse:  89    Resp:  17    Temp: 98.4 F (36.9 C)     TempSrc: Oral     SpO2:  97%    Weight:    100.2 kg  Height:    5' 10 (1.778 m)   Eyes: Anicteric no pallor. ENMT: No discharge from the ears eyes nose or mouth. Neck: No mass felt.  No neck rigidity. Respiratory: No rhonchi or crepitations. Cardiovascular: S1-S2 heard. Abdomen: Soft nontender bowel sounds present. Musculoskeletal: No edema. Skin: No rash. Neurologic: Alert awake oriented time place and person.  Moves all extremities. Psychiatric: Appears normal.  Normal affect.   Labs on Admission: I have personally reviewed following labs and imaging studies  CBC: Recent Labs  Lab 06/16/24 1759 06/17/24 0023  WBC 8.4  --   HGB 7.0* 7.2*  HCT 22.3* 22.3*  MCV 103.7*  --   PLT 245  --    Basic Metabolic Panel: Recent Labs  Lab 06/16/24 1759  NA 140  K 4.6  CL 100  CO2 23  GLUCOSE 84  BUN 92*  CREATININE 20.25*  CALCIUM  7.4*   GFR: Estimated Creatinine Clearance: 5.4 mL/min (A) (by C-G formula based on SCr of 20.25 mg/dL (H)). Liver Function Tests: No results for input(s): AST, ALT, ALKPHOS, BILITOT, PROT, ALBUMIN in the last 168 hours. No results for input(s): LIPASE, AMYLASE in the last 168 hours. No results for input(s): AMMONIA in the last 168 hours. Coagulation Profile: No results for input(s): INR, PROTIME in the last 168 hours. Cardiac Enzymes: No results for input(s): CKTOTAL, CKMB, CKMBINDEX, TROPONINI in the last 168  hours. BNP (last 3 results) No results for input(s): PROBNP in the last 8760 hours. HbA1C: No results for input(s): HGBA1C in the last 72 hours. CBG: No results for input(s): GLUCAP in the last 168 hours. Lipid Profile: No results for input(s): CHOL, HDL, LDLCALC, TRIG, CHOLHDL, LDLDIRECT in the last 72 hours. Thyroid Function Tests: No results for input(s): TSH, T4TOTAL, FREET4, T3FREE, THYROIDAB in the last 72 hours. Anemia Panel: No results for input(s): VITAMINB12, FOLATE, FERRITIN, TIBC, IRON , RETICCTPCT in the last 72 hours. Urine analysis:    Component Value Date/Time   COLORURINE STRAW (A) 07/07/2023 0734   APPEARANCEUR CLEAR 07/07/2023 0734   LABSPEC 1.010 07/07/2023 0734   PHURINE 6.0 07/07/2023 0734   GLUCOSEU 50 (A) 07/07/2023 0734   HGBUR SMALL (A) 07/07/2023 0734   BILIRUBINUR NEGATIVE 07/07/2023 0734   KETONESUR NEGATIVE 07/07/2023 0734   PROTEINUR 100 (A)  07/07/2023 0734   NITRITE NEGATIVE 07/07/2023 0734   LEUKOCYTESUR SMALL (A) 07/07/2023 0734   Sepsis Labs: @LABRCNTIP (procalcitonin:4,lacticidven:4) )No results found for this or any previous visit (from the past 240 hours).   Radiological Exams on Admission: DG Chest 1 View Result Date: 06/16/2024 CLINICAL DATA:  Right-sided chest pain. EXAM: CHEST  1 VIEW COMPARISON:  July 07, 2023 FINDINGS: The heart size and mediastinal contours are within normal limits. Both lungs are clear. The visualized skeletal structures are unremarkable. IMPRESSION: No active disease. Electronically Signed   By: Suzen Dials M.D.   On: 06/16/2024 18:58    EKG: Independently reviewed.  Normal sinus rhythm.  Assessment/Plan Principal Problem:   Chest pain Active Problems:   Essential hypertension   Type 2 diabetes mellitus with other specified complication (HCC)   ESRD on dialysis (HCC)    Chest pain mostly on the right side on deep inspiration and moving his right upper  extremity also has pain on deep palpation suspect musculoskeletal.  Given the elevated D-dimer  will check VQ scan since patient has severe allergy to contrast dye.  Check 2D echo.  Troponins were negative. Hypertension uncontrolled takes hydralazine  and metoprolol .  Patient states he still takes Lasix .  Follow blood pressure trends.  Will keep patient on as needed IV labetalol. ESRD on peritoneal dialysis.  Consult nephrology for dialysis. Macrocytic anemia appears to be chronic.  Fecal occult blood is negative.  Will check B12 and folate levels and follow CBC. Diabetes mellitus type 2 diet control.  Last hemoglobin A1c 2 months ago was 6.  Will closely monitor CBGs. Sleep apnea on CPAP at bedtime.  Since patient has chest pain will need close monitoring further workup and more than 2 midnight Mullins.   DVT prophylaxis: Heparin . Code Status: Full code. Family Communication: Discussed with patient. Disposition Plan: Monitored bed. Consults called: Consult nephrology in the morning for dialysis. Admission status: Observation.

## 2024-06-17 NOTE — ED Notes (Signed)
 Attempted for PIV, unsuccessful.

## 2024-06-18 LAB — BPAM RBC
Blood Product Expiration Date: 202511232359
ISSUE DATE / TIME: 202510281405
Unit Type and Rh: 5100

## 2024-06-18 LAB — TYPE AND SCREEN
ABO/RH(D): O POS
Antibody Screen: NEGATIVE
Unit division: 0

## 2024-07-22 ENCOUNTER — Other Ambulatory Visit: Payer: Self-pay | Admitting: Podiatry

## 2024-09-11 ENCOUNTER — Ambulatory Visit: Admitting: Podiatry
